# Patient Record
Sex: Female | Born: 1935 | Hispanic: Yes | Smoking: Former smoker
Health system: Southern US, Community
[De-identification: ages and names within clinical notes are randomized; demographics above are authoritative.]

## PROBLEM LIST (undated history)

## (undated) ENCOUNTER — Emergency Department (HOSPITAL_COMMUNITY): Admission: EM | Payer: Self-pay | Source: Home / Self Care

## (undated) DIAGNOSIS — R45851 Suicidal ideations: Secondary | ICD-10-CM

## (undated) DIAGNOSIS — F0390 Unspecified dementia without behavioral disturbance: Secondary | ICD-10-CM

## (undated) DIAGNOSIS — I10 Essential (primary) hypertension: Secondary | ICD-10-CM

## (undated) DIAGNOSIS — F32A Depression, unspecified: Secondary | ICD-10-CM

## (undated) DIAGNOSIS — F419 Anxiety disorder, unspecified: Secondary | ICD-10-CM

## (undated) DIAGNOSIS — K219 Gastro-esophageal reflux disease without esophagitis: Secondary | ICD-10-CM

## (undated) DIAGNOSIS — F329 Major depressive disorder, single episode, unspecified: Secondary | ICD-10-CM

## (undated) HISTORY — DX: Gastro-esophageal reflux disease without esophagitis: K21.9

## (undated) HISTORY — DX: Essential (primary) hypertension: I10

## (undated) HISTORY — PX: EYE SURGERY: SHX253

---

## 1995-05-15 ENCOUNTER — Other Ambulatory Visit: Payer: Self-pay

## 2014-01-29 ENCOUNTER — Ambulatory Visit: Payer: Self-pay | Attending: Family Medicine | Admitting: Family Medicine

## 2014-01-29 ENCOUNTER — Ambulatory Visit (HOSPITAL_COMMUNITY)
Admission: RE | Admit: 2014-01-29 | Discharge: 2014-01-29 | Disposition: A | Discharge status: Home / Self Care | Payer: Self-pay | Source: Ambulatory Visit | Attending: Family Medicine | Admitting: Family Medicine

## 2014-01-29 ENCOUNTER — Encounter: Payer: Self-pay | Admitting: Family Medicine

## 2014-01-29 VITALS — BP 162/92 | HR 51 | Temp 98.0°F | Resp 16 | Wt 144.0 lb

## 2014-01-29 DIAGNOSIS — R0981 Nasal congestion: Secondary | ICD-10-CM | POA: Insufficient documentation

## 2014-01-29 DIAGNOSIS — R0602 Shortness of breath: Secondary | ICD-10-CM | POA: Insufficient documentation

## 2014-01-29 DIAGNOSIS — Z87891 Personal history of nicotine dependence: Secondary | ICD-10-CM | POA: Insufficient documentation

## 2014-01-29 DIAGNOSIS — M47816 Spondylosis without myelopathy or radiculopathy, lumbar region: Secondary | ICD-10-CM

## 2014-01-29 DIAGNOSIS — M545 Low back pain: Secondary | ICD-10-CM | POA: Insufficient documentation

## 2014-01-29 DIAGNOSIS — K429 Umbilical hernia without obstruction or gangrene: Secondary | ICD-10-CM | POA: Insufficient documentation

## 2014-01-29 DIAGNOSIS — R109 Unspecified abdominal pain: Secondary | ICD-10-CM | POA: Insufficient documentation

## 2014-01-29 DIAGNOSIS — H1013 Acute atopic conjunctivitis, bilateral: Secondary | ICD-10-CM

## 2014-01-29 DIAGNOSIS — I10 Essential (primary) hypertension: Secondary | ICD-10-CM | POA: Insufficient documentation

## 2014-01-29 DIAGNOSIS — R52 Pain, unspecified: Secondary | ICD-10-CM | POA: Insufficient documentation

## 2014-01-29 DIAGNOSIS — Z113 Encounter for screening for infections with a predominantly sexual mode of transmission: Secondary | ICD-10-CM

## 2014-01-29 DIAGNOSIS — IMO0001 Reserved for inherently not codable concepts without codable children: Secondary | ICD-10-CM | POA: Insufficient documentation

## 2014-01-29 DIAGNOSIS — K625 Hemorrhage of anus and rectum: Secondary | ICD-10-CM | POA: Insufficient documentation

## 2014-01-29 DIAGNOSIS — G8929 Other chronic pain: Secondary | ICD-10-CM | POA: Insufficient documentation

## 2014-01-29 DIAGNOSIS — K5900 Constipation, unspecified: Secondary | ICD-10-CM | POA: Insufficient documentation

## 2014-01-29 DIAGNOSIS — E559 Vitamin D deficiency, unspecified: Secondary | ICD-10-CM | POA: Insufficient documentation

## 2014-01-29 DIAGNOSIS — K219 Gastro-esophageal reflux disease without esophagitis: Secondary | ICD-10-CM

## 2014-01-29 DIAGNOSIS — R202 Paresthesia of skin: Secondary | ICD-10-CM | POA: Insufficient documentation

## 2014-01-29 DIAGNOSIS — Z0000 Encounter for general adult medical examination without abnormal findings: Secondary | ICD-10-CM

## 2014-01-29 DIAGNOSIS — Z114 Encounter for screening for human immunodeficiency virus [HIV]: Secondary | ICD-10-CM | POA: Insufficient documentation

## 2014-01-29 DIAGNOSIS — M1612 Unilateral primary osteoarthritis, left hip: Secondary | ICD-10-CM

## 2014-01-29 DIAGNOSIS — R4789 Other speech disturbances: Secondary | ICD-10-CM

## 2014-01-29 DIAGNOSIS — M25552 Pain in left hip: Secondary | ICD-10-CM | POA: Insufficient documentation

## 2014-01-29 DIAGNOSIS — J309 Allergic rhinitis, unspecified: Secondary | ICD-10-CM

## 2014-01-29 DIAGNOSIS — L298 Other pruritus: Secondary | ICD-10-CM | POA: Insufficient documentation

## 2014-01-29 DIAGNOSIS — E119 Type 2 diabetes mellitus without complications: Secondary | ICD-10-CM | POA: Insufficient documentation

## 2014-01-29 LAB — GLUCOSE, POCT (MANUAL RESULT ENTRY): POC Glucose: 111 mg/dl — AB (ref 70–99)

## 2014-01-29 LAB — COMPLETE METABOLIC PANEL WITH GFR
ALK PHOS: 98 U/L (ref 39–117)
ALT: 28 U/L (ref 0–35)
AST: 26 U/L (ref 0–37)
Albumin: 4.1 g/dL (ref 3.5–5.2)
BILIRUBIN TOTAL: 0.5 mg/dL (ref 0.2–1.2)
BUN: 19 mg/dL (ref 6–23)
CO2: 26 meq/L (ref 19–32)
Calcium: 9.1 mg/dL (ref 8.4–10.5)
Chloride: 104 mEq/L (ref 96–112)
Creat: 0.69 mg/dL (ref 0.50–1.10)
GFR, EST NON AFRICAN AMERICAN: 80 mL/min
Glucose, Bld: 75 mg/dL (ref 70–99)
Potassium: 4.7 mEq/L (ref 3.5–5.3)
SODIUM: 139 meq/L (ref 135–145)
TOTAL PROTEIN: 7.5 g/dL (ref 6.0–8.3)

## 2014-01-29 LAB — POCT URINALYSIS DIPSTICK
Bilirubin, UA: NEGATIVE
GLUCOSE UA: NEGATIVE
Ketones, UA: NEGATIVE
Leukocytes, UA: NEGATIVE
Nitrite, UA: NEGATIVE
Protein, UA: NEGATIVE
RBC UA: NEGATIVE
Spec Grav, UA: 1.01
Urobilinogen, UA: 0.2
pH, UA: 6.5

## 2014-01-29 LAB — LIPID PANEL
CHOL/HDL RATIO: 3.6 ratio
Cholesterol: 177 mg/dL (ref 0–200)
HDL: 49 mg/dL (ref >39)
LDL Cholesterol: 108 mg/dL — ABNORMAL HIGH (ref 0–99)
Triglycerides: 102 mg/dL (ref <150)
VLDL: 20 mg/dL (ref 0–40)

## 2014-01-29 LAB — VITAMIN B12: Vitamin B-12: 1985 pg/mL — ABNORMAL HIGH (ref 211–911)

## 2014-01-29 LAB — POCT GLYCOSYLATED HEMOGLOBIN (HGB A1C): Hemoglobin A1C: 6.1

## 2014-01-29 LAB — TSH: TSH: 1.829 u[IU]/mL (ref 0.350–4.500)

## 2014-01-29 LAB — LIPASE: Lipase: 19 U/L (ref 0–75)

## 2014-01-29 MED ORDER — FLUTICASONE PROPIONATE 50 MCG/ACT NA SUSP: 2.0000 | Freq: Every day | NASAL | Status: AC

## 2014-01-29 MED ORDER — RANITIDINE HCL 150 MG PO CAPS: 150.0000 mg | ORAL_CAPSULE | Freq: Two times a day (BID) | ORAL | Status: DC

## 2014-01-29 MED ORDER — METHYLPREDNISOLONE ACETATE 80 MG/ML IJ SUSP
80.0000 mg | Freq: Once | INTRAMUSCULAR | Status: AC
  Administered 2014-01-29: 80 mg via INTRAMUSCULAR

## 2014-01-29 NOTE — Patient Instructions (Addendum)
Barbara Beck,  Thank you for coming in today. It was a pleasure meeting you. I look forward to being your primary doctor.   1. HTN: Continue norvasc for now but we will plan to change to a different BP medicine after reviewing labs.  2. Shortness of breath: Evaluate for COPD, CHF, anemia.  Labs Chest x-ray   3. Facial ithching, tearing = allergic rhinoconjunctivitis Shot of steroid today flonase-nasal steroid   4. Umbilical (belly button) hernia: Avoid straining, avoid constipation.   You will be called with results  F/u in 2-3 weeks for lab review and physical with pelvic exam.  Dr. Armen PickupFunches

## 2014-01-29 NOTE — Assessment & Plan Note (Signed)
A: reducible umbilical hernia, chronic, patient is not interested in elective hernia repair P: Avoid straining (heavy lifting, squatting and constipation) Reviewed s/s of incarceration which should urgent evaluation at urgent care or ED with patient and her daughter-in-law

## 2014-01-29 NOTE — Assessment & Plan Note (Signed)
A: Shortness of breath: differential include COPD, CHF, anemia, malignancy P:  CBC with diff, TSH, CMP Chest x-ray followed by 2D ECHO if there is cardiomegaly on CXR

## 2014-01-29 NOTE — Assessment & Plan Note (Signed)
A: HTN: BP above goal with orthostatic symptoms  P: Continue norvasc for now but we will plan to change to a different BP medicine after reviewing labs due to bradycardia and orthostatic symptoms. Will likely switch to an ACE/ARB.

## 2014-01-29 NOTE — Progress Notes (Signed)
Pt is here to establish care. Pt states that she has chronic pain on her left side. Pt states that she may have a hernia. Pt wants to be tested for diabetes. Pt has an interpreter.

## 2014-01-29 NOTE — Assessment & Plan Note (Addendum)
A: exam and history support this diagnosis P: Shot of steroid today, Depo medrol 80 mg IM x one (chose IM instead or oral because of GERD hx) followed by flonase-nasal steroid

## 2014-01-29 NOTE — Assessment & Plan Note (Signed)
Screening HIV  

## 2014-01-29 NOTE — Progress Notes (Addendum)
   Subjective:    Patient ID: Barbara Beck, female    DOB: 1927/07/30, 81 y.o.   MRN: [id number] CC: establish care, chronic R side pain (? Hernia), HTN, screening for diabetes  HPI  81 yo Hispanic female, spanish speaking, presents with her daughter-in-law to establish care and discuss the following: History: obtained via use of Spanish interpreter  1. HTN: Does not recall date diagnosis. Is compliant with her Norvasc 5 mg daily. She admits to shortness of breath when lying flat and dizziness on standing. She denies acute loss of vision, headache, chest pain, syncope and peripheral edema.  2. R side pain: This is a chronic problem. Sharp right lower abdomen. No associated fever, chills, nausea, vomiting, diarrhea or dysuria. Patient admits to intermittent constipation, occasionally accompanied by rectal bleeding.  3. Facial itching: Chronic problem. Patient is associated with dry skin especially around her eyes. Excessive tearing. Nasal congestion.  4. SOB: The is patient has been short of breath especially when lying flat. She denies chronic cough. She denies chest pain. She denies peripheral edema. She admits to cold intolerance.  5. Joint pain: Patient with chronic low back and left hip pain. She takes medication: Continue diclofenac along with vitamin B1 and B6 and B12. Medication helps her pain. She takes medication as needed.  Soc Hx: former smoker 2 PPD for 13 years, quit in 1986 Med Hx: HTN unsure of diagnosis Surg Hx: cataract surgery L eye unsure of date  Review of Systems As per HPI Low back pain. Left hip pain.    Objective:   Physical Exam BP 162/92 mmHg  Pulse 51  Temp(Src) 98 F (36.7 C) (Oral)  Resp 16  Wt 144 lb (65.318 kg)  SpO2 51% General appearance: alert, cooperative and no distress, elderly female  Skin: warm, dry, intact, xerotic with hyperpigmentation under the eyes.  Nose: swollen nasal turbinate, pale, no drainage or erythema Throat: normal tongue,  could not visualize oropharynx even with tongue depressor Neck: swollen submental lymph nodes  Lungs: clear to auscultation bilaterally Heart: regular rate and rhythm, S1, S2 normal, no murmur, click, rub or gallop Abdomen: NABS, obese, soft, reducible superior umbilical hernia, mild TTP w/o rebound or guarding in RLQ and epigastric area.  Extremities: traceedema    Lab Results  Component Value Date   HGBA1C 6.1 01/29/2014       Assessment & Plan:

## 2014-01-30 LAB — HIV ANTIBODY (ROUTINE TESTING W REFLEX): HIV 1&2 Ab, 4th Generation: NONREACTIVE

## 2014-01-31 NOTE — Addendum Note (Signed)
Addended by: Dessa PhiFUNCHES, Symir Mah on: 01/31/2014 06:21 PM   Modules accepted: Orders, Medications

## 2014-02-01 ENCOUNTER — Telehealth: Payer: Self-pay | Admitting: *Deleted

## 2014-02-01 NOTE — Telephone Encounter (Signed)
Left voice message to return call (information was left in Spanish)

## 2014-02-01 NOTE — Telephone Encounter (Signed)
-----   Message from Lora PaulaJosalyn C Funches, MD sent at 01/31/2014  6:21 PM EST ----- Normal labs. With slightly elevated LDL and high B12 consistent with over supplementation.  Patient may discontinue simvastatin

## 2014-02-12 ENCOUNTER — Telehealth: Payer: Self-pay | Admitting: Family Medicine

## 2014-02-12 NOTE — Telephone Encounter (Addendum)
Patients family member called to update the information needed for her mothers [employer] transportation application to be completed. [employer] stated that on page 2, question 5 the "yes" box was checked of but not given the "explanation". Facility would like the form to be completed and faxed to [employer] Fax:[phone]. Please f/u with family member for any questions.

## 2014-02-19 ENCOUNTER — Ambulatory Visit: Payer: Self-pay | Attending: Family Medicine | Admitting: Family Medicine

## 2014-02-19 ENCOUNTER — Other Ambulatory Visit: Payer: Self-pay

## 2014-02-19 ENCOUNTER — Encounter: Payer: Self-pay | Admitting: Family Medicine

## 2014-02-19 ENCOUNTER — Ambulatory Visit (HOSPITAL_COMMUNITY)
Admission: RE | Admit: 2014-02-19 | Discharge: 2014-02-19 | Disposition: A | Discharge status: Home / Self Care | Payer: Self-pay | Source: Ambulatory Visit | Attending: Family Medicine | Admitting: Family Medicine

## 2014-02-19 VITALS — BP 178/92 | HR 44 | Temp 97.9°F | Resp 16 | Ht 58.5 in | Wt 136.0 lb

## 2014-02-19 DIAGNOSIS — Z23 Encounter for immunization: Secondary | ICD-10-CM

## 2014-02-19 DIAGNOSIS — H269 Unspecified cataract: Secondary | ICD-10-CM

## 2014-02-19 DIAGNOSIS — J029 Acute pharyngitis, unspecified: Secondary | ICD-10-CM | POA: Insufficient documentation

## 2014-02-19 DIAGNOSIS — R001 Bradycardia, unspecified: Secondary | ICD-10-CM | POA: Insufficient documentation

## 2014-02-19 DIAGNOSIS — N750 Cyst of Bartholin's gland: Secondary | ICD-10-CM

## 2014-02-19 DIAGNOSIS — I10 Essential (primary) hypertension: Secondary | ICD-10-CM

## 2014-02-19 MED ORDER — LISINOPRIL-HYDROCHLOROTHIAZIDE 20-12.5 MG PO TABS: 1.0000 | ORAL_TABLET | Freq: Every day | ORAL | Status: DC

## 2014-02-19 NOTE — Assessment & Plan Note (Signed)
A: chronic cyst w/o pain P: monitoring. Gyn referral if it becomes symptomatic

## 2014-02-19 NOTE — Assessment & Plan Note (Signed)
R eye cataract: referral to ophthalmology

## 2014-02-19 NOTE — Patient Instructions (Addendum)
Barbara Beck,  Thank you for coming in today.   1. High blood pressure goal is < 150/90 Start prinzide 20-12.5 mg once daily Return in 3 weeks for repeat BP check with the nurse with plan to increase to two tabs if BP above goal.   2. Slow heart rate:  Referral to cardiology   3. R eye cataract: referral to ophthalmology   4. Bartholin gland cyst: cyst without abscess. since it is not bothering you it does not need to be treated. If it starts to bother you I will refer you to gynecology.   5. Unsure if uterus is present, w/o bleeding or pelvic pain. No need for imaging at this time. Cervix is present.   F/u in 3 weeks with nurse for BP check  F/u with me in 3 months for high blood pressure sooner if needed  Dr. Georgena SpurlingFunches   Absceso o quiste de Bartolino (Bartholin's Cyst or Abscess) Las glndulas de GreshamBartolino son glndulas pequeas ubicadas dentro de los pliegues de la piel (labios) a los lados de la apertura de la vagina (canal del parto). Cuando el conducto de la glndula se Lynchobstruye, puede desarrollarse un quiste. Cuando esto ocurre, el lquido que se acumula dentro del quiste puede llegar a infectarse. Esto se conoce como absceso. La glndula de Bartolino produce una mucosidad lquida en la parte externa de la vagina durante las relaciones sexuales. SNTOMAS  Los Lyondell Chemicalpacientes que presentan un quiste pequeo no tienen problemas.  Podr sentir desde una leve molestia a un dolor intenso segn el tamao del quiste y si existe infeccin o no,  Financial risk analystentir dolor, inflamacin e hinchazn en la zona inferior de la vagina.  Dolor en las relaciones sexuales.  Presin en las zona del perineo.  Hinchazn de los labios de la vagina.  El quiste puede estar en uno o ambos lados de la vagina. DIAGNSTICO  El profesional podr observar una gran zona hinchada en la parte inferior de la vagina.  Es una zona dolorosa al tacto.  Si se trata de un absceso habr inflamacin y  Engineer, miningdolor. TRATAMIENTO  En algunos casos el quiste desaparecer sin tratamiento.  Aplique compresas tibias hmedas en la zona o tome baos de asiento varias veces al da.  Le practicarn una incisin para drenar el quiste o el absceso, previa aplicacin de anestesia local.  Si se trata de un absceso le indicarn un cultivo del pus.  Y en ese caso le prescribirn un tratamiento con antibiticos.  Se realizar una abertura en la glndula, suturando los bordes para hacer la abertura ms grande (Methuen Townmarsupializacin).  Si aparece nuevamente el quiste o el absceso, le extirparn toda la glndula. PREVENCIN  Mantenga una buena higiene.  Higienice la zona vaginal con jabn neutro y un pao suave.  No frote la zona al darse un bao.  Proteja la zona de la entrepierna con un apsito si realiza largos paseos en bicicleta o a caballo.  Asegrese de estar bien lubricada cuando mantenga relaciones sexuales. INSTRUCCIONES PARA EL CUIDADO DOMICILIARIO  Si su quiste o absceso ha sido abierto, pudieran haberle colocado un pequeo trozo de gasa o un drenaje para permitir que la herida supure. La gasa o el drenaje a menos que se lo indique el profesional que le asiste.  Use toallas femeninas y no tampones cuando lo necesite en caso de drenaje o sangrado.  Si le han recetado medicamentos que combaten los grmenes (antibiticos ), tmelos exactamente de la manera que le haya sido indicada. Asegrese  de terminar con todo el ciclo de antibiticos.  Utilice los medicamentos de venta libre o de prescripcin para Chief Technology Officer, Environmental health practitioner o la Frontin, segn se lo indique el profesional que lo asiste. SOLICITE ATENCIN MDICA DE INMEDIATO SI:  Aumenta el dolor, el enrojecimiento, la hinchazn o la supuracin.  La herida ha sangrado al punto que ha debido usar ms apsitos de los que la cantidad de apsitos sugerida por el mdico en 24 horas.  Siente escalofros.  Tiene fiebre.  Tiene algn problema (sntoma)  nuevo o se agravan lo ya existentes. EST SEGURO QUE:  Comprende las instrucciones para el alta mdica.  Controlar su enfermedad.  Solicitar atencin mdica de inmediato segn las indicaciones. Document Released: 01/04/2005 Document Revised: 03/29/2011 [facility] Patient Information 2015 [place], [place]. This information is not intended to replace advice given to you by your health care provider. Make sure you discuss any questions you have with your health care provider.

## 2014-02-19 NOTE — Progress Notes (Signed)
   Subjective:    Patient ID: Barbara Beck, female    DOB: 04-01-1927, 81 y.o.   MRN: [id number] CC: wellness physical  HPI   HM: due for flu and amenable to it  Review of Systems General:  Negative for nexplained weight loss, fever Skin: Negative for new or changing mole, sore that won't heal HEENT: Negative for trouble hearing, trouble seeing, ringing in ears, mouth sores, hoarseness, change in voice, dysphagia. CV:  Negative for chest pain, dyspnea, edema, palpitations Resp: Negative for cough, dyspnea, hemoptysis GI: Negative for nausea, vomiting, diarrhea, constipation, abdominal pain, melena, hematochezia. GU: Negative for dysuria, incontinence, urinary hesitance, hematuria, vaginal or penile discharge, polyuria, sexual difficulty, lumps in testicle or breasts MSK: Negative for muscle cramps or aches, joint pain or swelling Neuro: Negative for headaches, weakness, numbness, dizziness, passing out/fainting Psych: Negative for depression, anxiety, memory problems      Objective:   Physical Exam BP 178/92 mmHg  Pulse 44  Temp(Src) 97.9 F (36.6 C)  Resp 16  Ht 4' 10.5" (1.486 m)  Wt 136 lb (61.689 kg)  BMI 27.94 kg/m2  SpO2 99%  General Appearance:    Alert, cooperative, no distress, appears stated age  Head:    Normocephalic, without obvious abnormality, atraumatic  Eyes:    PERRL, cornea clear and irregular on L. Cornea cloudy on R.   Ears:    Normal TM's and external ear canals, both ears  Nose:   Swollen turbinates pink w/o discharge   Throat:   Upper dentures, lower with partial, no abscess, L anterior tongue slightly purple (patient report biting tongue)   Neck:   Supple, symmetrical, trachea midline, thyroid not enlarge. Anterior cervical adenopathy b/l mild, non tender.      Back:     Symmetric, no curvature, ROM normal, no CVA tenderness  Lungs:     Clear to auscultation bilaterally, respirations unlabored  Chest Wall:    No tenderness or deformity   Heart:     Brady heart rate, regular  rhythm, S1 and S2 normal, no murmur, rub   or gallop  Breast Exam:    No tenderness, masses, or nipple abnormality  Abdomen:     Soft, non-tender, bowel sounds active all four quadrants,    no masses, no organomegaly  Genitalia:    Cystic swelling at R introitus, non tender. Speculum exam reveals normal cervix scant white vaginal discharge. bimanual exam w/o fundal or adnexal mass or tenderness.   Rectal:   Deferred   Extremities:   Extremities normal, atraumatic, no cyanosis or edema  Pulses:   2+ and symmetric all extremities  Skin:   Skin color, texture, turgor normal, no rashes or lesions  Lymph nodes:   Cervical, supraclavicular, and axillary nodes normal  Neurologic:   CNII-XII intact, normal strength, sensation and reflexes    throughout    EKG: sinus bradycardia.      Assessment & Plan:

## 2014-02-19 NOTE — Assessment & Plan Note (Signed)
1. High blood pressure goal is < 150/90 Start prinzide 20-12.5 mg once daily Return in 3 weeks for repeat BP check with the nurse with plan to increase to  prinzide 40-25 if BP above goal.

## 2014-02-19 NOTE — Progress Notes (Signed)
Interpreter graciela used as well as pacific interpreter [phone] Patient here for annual exam Last visit she was told that her amlodipine may be switched once MD reviewed labs Patient had episode of diarrhea after she ate today and then she felt weak.  Patient [place] Patient agrees to flu shot today

## 2014-02-19 NOTE — Assessment & Plan Note (Signed)
A. Slow heart rate: sinus bradycardia. Patient not taking norvasc for 15 days P: Treat HTN Referral to cardiology

## 2014-02-20 ENCOUNTER — Ambulatory Visit (HOSPITAL_COMMUNITY)
Admission: RE | Admit: 2014-02-20 | Discharge: 2014-02-20 | Disposition: A | Discharge status: Home / Self Care | Payer: Self-pay | Source: Ambulatory Visit | Attending: Cardiology | Admitting: Cardiology

## 2014-02-20 ENCOUNTER — Encounter: Payer: Self-pay | Admitting: Family Medicine

## 2014-02-25 ENCOUNTER — Telehealth: Payer: Self-pay | Admitting: Family Medicine

## 2014-02-25 ENCOUNTER — Ambulatory Visit: Payer: Self-pay | Attending: Family Medicine

## 2014-02-25 NOTE — Telephone Encounter (Signed)
Patient is requesting a refill for:  Calcium Carb-Cholecalciferol (CALCIUM-VITAMIN D) 600-400 MG-UNIT TABS diclofenac (VOLTAREN) 75 MG EC tablet Thiamine 50 MG CAPS pyridOXINE (B-6) 50 MG tablet cyanocobalamin 1000 MCG tablet  Please follow up with pt.

## 2014-02-26 ENCOUNTER — Telehealth: Payer: Self-pay | Admitting: *Deleted

## 2014-02-26 ENCOUNTER — Other Ambulatory Visit: Payer: Self-pay | Admitting: *Deleted

## 2014-02-28 MED ORDER — THIAMINE 50 MG PO CAPS: 50.0000 mg | ORAL_CAPSULE | Freq: Every day | ORAL | Status: DC

## 2014-02-28 MED ORDER — PYRIDOXINE HCL 50 MG PO TABS: 50.0000 mg | ORAL_TABLET | Freq: Every day | ORAL | Status: DC

## 2014-02-28 MED ORDER — CYANOCOBALAMIN 500 MCG PO TABS: 500.0000 ug | ORAL_TABLET | Freq: Every day | ORAL | Status: AC

## 2014-02-28 NOTE — Addendum Note (Signed)
Addended by: Dessa PhiFUNCHES, Ruqayya Ventress on: 02/28/2014 06:48 PM   Modules accepted: Orders

## 2014-02-28 NOTE — Telephone Encounter (Signed)
Vit b6 and B12, thiamine refilled. Please d/c diclofenac and use tylenol 650 mg TID instead for pain due to HTN.

## 2014-03-01 NOTE — Telephone Encounter (Signed)
LVM to return call.

## 2014-03-11 ENCOUNTER — Ambulatory Visit: Payer: Self-pay | Attending: Family Medicine | Admitting: *Deleted

## 2014-03-11 VITALS — BP 134/60 | HR 53 | Temp 98.4°F | Resp 18

## 2014-03-11 DIAGNOSIS — I10 Essential (primary) hypertension: Secondary | ICD-10-CM | POA: Insufficient documentation

## 2014-03-11 MED ORDER — ACETAMINOPHEN 325 MG PO TABS: 650.0000 mg | ORAL_TABLET | Freq: Three times a day (TID) | ORAL | Status: DC | PRN

## 2014-03-11 NOTE — Progress Notes (Signed)
Spoke with patient via [employer] Interpreter, Star [place], [place] [phone] Patient presents with daughter-in-law for BP check Med list reviewed; states taking all meds as directed Does not add salt or cook with salt. Eats fresh foods [facility] Doing yoga and chair exercises 5 days a week C/o diarrhea (watery stools) 4 times/day directly after eating for several months. [place] was there June 2015 through January 2016  BP 134/60 P 53 R  18 T  98.4 oral SPO2  97%  Advised to make f/u with PCP to discuss diarrhea  Patient advised to call for med refills at least 7 days before running out so as not to go without.

## 2014-03-19 ENCOUNTER — Ambulatory Visit: Payer: Self-pay | Attending: Family Medicine | Admitting: Family Medicine

## 2014-03-19 ENCOUNTER — Encounter: Payer: Self-pay | Admitting: Family Medicine

## 2014-03-19 VITALS — BP 162/77 | HR 54 | Temp 97.8°F | Resp 16 | Ht 59.0 in | Wt 132.0 lb

## 2014-03-19 DIAGNOSIS — K529 Noninfective gastroenteritis and colitis, unspecified: Secondary | ICD-10-CM

## 2014-03-19 DIAGNOSIS — I10 Essential (primary) hypertension: Secondary | ICD-10-CM

## 2014-03-19 MED ORDER — LISINOPRIL-HYDROCHLOROTHIAZIDE 20-25 MG PO TABS: 1.0000 | ORAL_TABLET | Freq: Every day | ORAL | Status: DC

## 2014-03-19 MED ORDER — LISINOPRIL-HYDROCHLOROTHIAZIDE 20-12.5 MG PO TABS: 2.0000 | ORAL_TABLET | Freq: Every day | ORAL | Status: DC

## 2014-03-19 NOTE — Assessment & Plan Note (Signed)
Assessment:  Chronic diarrhea: improved.  Normal abdominal exam today You have lost weight since the beginning of the year Wt Readings from Last 3 Encounters:  03/19/14 132 lb (59.875 kg)  02/19/14 136 lb (61.689 kg)  01/29/14 144 lb (65.318 kg)   Plan:  To clarify, calcium carbonate may cause bloating, constipation and gas (not diarrhea).  Plan to monitor and collect stools-if watery will perform stool studies. Continue to eat regular meals. If you notice blood in stool please notify me if that occurs a diagnostic colonoscopy may be indicated.

## 2014-03-19 NOTE — Progress Notes (Signed)
Patient here for follow up to diarrhea. Patient states that her diarrhea is better. Patient complain of pain like her nerves and tendons hurt at times. Patient states that her b/p is dropping in the mornings after having elevated b/p.

## 2014-03-19 NOTE — Patient Instructions (Addendum)
Barbara Beck,  Thank you for coming in today.  1. HTN: Elevated BP with low pulse Increase prinzide to two tab once daily from 20-12.5 to 20-25 mg (new prescription, increased dose of diuretic hydrochlorothiazide).  Low pulse is called bradycardia if bradycardia becomes symptomatic (fainting) seeing a cardiologist to discuss a pacemaker is recmmended   2. Diarrhea: Normal abdominal exam today You have lost weight since the beginning of the year Wt Readings from Last 3 Encounters:  03/19/14 132 lb (59.875 kg)  02/19/14 136 lb (61.689 kg)  01/29/14 144 lb (65.318 kg)   To clarify, calcium carbonate may cause bloating, constipation and gas (not diarrhea).  Plan to monitor and collect stools-if watery will perform stool studies. Continue to eat regular meals. If you notice blood in stool please notify me if that occurs a diagnostic colonoscopy may be indicated.   F/u for BP check with nurse in 4 weeks   F/u with me in 3 months   Dr. Armen PickupFunches

## 2014-03-19 NOTE — Progress Notes (Signed)
   Subjective:    Patient ID: Barbara Beck, female    DOB: 03-05-1927, 81 y.o.   MRN: [id number] CC: f/u HTN, diarrhea  HPI  1. CHRONIC HYPERTENSION  Disease Monitoring  Blood pressure range: 134-177/87-96, Hr 47-53  Chest pain: no   Dyspnea: no   Claudication: no   Medication compliance: no  Medication Side Effects  Lightheadedness: no   Urinary frequency: no   Edema: no   2. Diarrhea: x 6 months while in [place]. Non-bloody. Per patient triggered by certain foods. No abdominal pain, cramping, fever. Patient reports normal stools x 1 week. No history of screening colonoscopy.   Review of Systems As per HPI     Objective:   Physical Exam BP 162/77 mmHg  Pulse 54  Temp(Src) 97.8 F (36.6 C)  Resp 16  Ht 4\' 11"  (1.499 m)  Wt 132 lb (59.875 kg)  BMI 26.65 kg/m2  SpO2 96%  Wt Readings from Last 3 Encounters:  03/19/14 132 lb (59.875 kg)  02/19/14 136 lb (61.689 kg)  01/29/14 144 lb (65.318 kg)  General appearance: alert, cooperative and no distress Lungs: clear to auscultation bilaterally Heart: regular rate and rhythm, S1, S2 normal, no murmur, click, rub or gallop Abdomen: soft, non-tender; bowel sounds normal; no masses,  no organomegaly     Assessment & Plan:

## 2014-03-19 NOTE — Assessment & Plan Note (Signed)
1. HTN: Elevated BP with low pulse Increase prinzide to two tab once daily from 20-12.5 to 20-25 mg (new prescription, increased dose of diuretic hydrochlorothiazide).  Low pulse is called bradycardia if bradycardia becomes symptomatic (fainting) seeing a cardiologist to discuss a pacemaker is recmmended

## 2014-04-12 ENCOUNTER — Ambulatory Visit (INDEPENDENT_AMBULATORY_CARE_PROVIDER_SITE_OTHER): Payer: No Typology Code available for payment source | Admitting: Cardiovascular Disease

## 2014-04-12 ENCOUNTER — Encounter: Payer: Self-pay | Admitting: Cardiovascular Disease

## 2014-04-12 VITALS — BP 144/80 | HR 57 | Ht 59.0 in | Wt 128.8 lb

## 2014-04-12 DIAGNOSIS — R0600 Dyspnea, unspecified: Secondary | ICD-10-CM

## 2014-04-12 DIAGNOSIS — R001 Bradycardia, unspecified: Secondary | ICD-10-CM

## 2014-04-12 DIAGNOSIS — I10 Essential (primary) hypertension: Secondary | ICD-10-CM

## 2014-04-12 MED ORDER — AMLODIPINE BESYLATE 2.5 MG PO TABS: 2.5000 mg | ORAL_TABLET | Freq: Every day | ORAL | Status: DC

## 2014-04-12 NOTE — Progress Notes (Signed)
Cardiology Office Note   Date:  04/12/2014   ID:  Nitzia Perren, DOB [birth year], MRN [id number]  PCP:  Lora Paula, MD  Cardiologist:   Vesta Mixer, MD   No chief complaint on file.     History of Present Illness: Barbara Beck is a 81 y.o. female who presents for evaluation of HTN She was seen with Delorise Royals ( intrepreter ) .  and daughter in law  She is orginally from [place].   Is seen at the [facility].  Was referred for further evaluation of this HTN. The BP has been well controlled until she moved to [place] She has been on the Lisinopril which has not worked as well has her previous BP pill that she was on while in [place].  She does not recall which medications she's been on in the past. She has lots of burning in legs  - sound like peripheral neuropathy.   No CP .  She does have DOE -  Goes to the senior center daily and does chair exercises.   Past Medical History  Diagnosis Date  . Hypertension ?   Marland Kitchen GERD (gastroesophageal reflux disease) ?     Past Surgical History  Procedure Laterality Date  . Eye surgery Left     Cataract Surgery      Current Outpatient Prescriptions  Medication Sig Dispense Refill  . acetaminophen (TYLENOL) 325 MG tablet Take 2 tablets (650 mg total) by mouth 3 (three) times daily as needed for moderate pain. 50 tablet 1  . Calcium Carb-Cholecalciferol (CALCIUM-VITAMIN D) 600-400 MG-UNIT TABS Take by mouth.    . cyanocobalamin 500 MCG tablet Take 1 tablet (500 mcg total) by mouth daily. 90 tablet 3  . lisinopril-hydrochlorothiazide (PRINZIDE,ZESTORETIC) 20-25 MG per tablet Take 1 tablet by mouth daily. 90 tablet 3  . pyridOXINE (B-6) 50 MG tablet Take 1 tablet (50 mg total) by mouth daily. 90 tablet 1  . ranitidine (ZANTAC) 150 MG capsule Take 1 capsule (150 mg total) by mouth 2 (two) times daily. 60 capsule 2  . Thiamine 50 MG CAPS Take 1 capsule (50 mg total) by mouth daily. 90 capsule 3  .  fluticasone (FLONASE) 50 MCG/ACT nasal spray Place 2 sprays into both nostrils daily. (Patient not taking: Reported on 04/12/2014) 16 g 6   No current facility-administered medications for this visit.    Allergies:   Aspirin    Social History:  The patient  reports that she quit smoking about 30 years ago. She has never used smokeless tobacco. She reports that she does not drink alcohol or use illicit drugs.   Family History:  The patient's family history includes Cancer in her daughter.    ROS:  Please see the history of present illness.    Review of Systems: Constitutional:  denies fever, chills, diaphoresis, appetite change and fatigue.  HEENT: denies photophobia, eye pain, redness, hearing loss, ear pain, congestion, sore throat, rhinorrhea, sneezing, neck pain, neck stiffness and tinnitus.  Respiratory: denies SOB, DOE, cough, chest tightness, and wheezing.  Cardiovascular: denies chest pain, palpitations and leg swelling.  Gastrointestinal: denies nausea, vomiting, abdominal pain, diarrhea, constipation, blood in stool.  Genitourinary: denies dysuria, urgency, frequency, hematuria, flank pain and difficulty urinating.  Musculoskeletal: denies  myalgias, back pain, joint swelling, arthralgias and gait problem.   Skin: denies pallor, rash and wound.  Neurological: denies dizziness, seizures, syncope, weakness, light-headedness, numbness and headaches.   Hematological: denies adenopathy, easy bruising, personal  or family bleeding history.  Psychiatric/ Behavioral: denies suicidal ideation, mood changes, confusion, nervousness, sleep disturbance and agitation.       All other systems are reviewed and negative.    PHYSICAL EXAM: VS:  BP 144/80 mmHg  Pulse 57  Ht 4\' 11"  (1.499 m)  Wt 128 lb 12.8 oz (58.423 kg)  BMI 26.00 kg/m2  SpO2 92% , BMI Body mass index is 26 kg/(m^2). GEN: Well nourished, well developed, in no acute distress HEENT: normal Neck: no JVD, carotid bruits,  or masses Cardiac: RRR; normal S1S2 no murmurs, rubs, or gallops,no edema  Respiratory:  clear to auscultation bilaterally, normal work of breathing GI: soft, nontender, nondistended, + BS MS: no deformity or atrophy Skin: warm and dry, no rash Neuro:  Strength and sensation are intact Psych: normal   EKG:  EKG is not ordered today. The ekg ordered today demonstrates    Recent Labs: 01/29/2014: ALT 28; BUN 19; Creatinine 0.69; Potassium 4.7; Sodium 139; TSH 1.829    Lipid Panel    Component Value Date/Time   CHOL 177 01/29/2014 1222   TRIG 102 01/29/2014 1222   HDL 49 01/29/2014 1222   CHOLHDL 3.6 01/29/2014 1222   VLDL 20 01/29/2014 1222   LDLCALC 108* 01/29/2014 1222      Wt Readings from Last 3 Encounters:  04/12/14 128 lb 12.8 oz (58.423 kg)  03/19/14 132 lb (59.875 kg)  02/19/14 136 lb (61.689 kg)      Other studies Reviewed: Additional studies/ records that were reviewed today include: . Review of the above records demonstrates:    ASSESSMENT AND PLAN:  1. Essential hypertension - we will add low-dose amlodipine-2.5 mg a day. Her blood pressure is just minimally elevated. If she remained stable and does not have any significant abnormalities seen on her echocardiogram then we will turn her back over to her general medical doctor.  2. Sinus Bradycardia- her heart rate is now normal. Continue current medications.  2. Shortness of breath with exertion: The patient has severe shortness breath with any sort of exertion. She's had hypertension for a long time. We will get an echocardiogram for further evaluation of what sounds like diastolic dysfunction.    Current medicines are reviewed at length with the patient today.  The patient does not have concerns regarding medicines.  The following changes have been made:  no change  Labs/ tests ordered today include:  No orders of the defined types were placed in this encounter.     Disposition:   FU with me in 3  months.     Signed, Edith Lord, Deloris PingPhilip J, MD  04/12/2014 4:11 PM    [facility] [address], [place], [place]  [id number] Phone: [phone]) [phone]; Fax: [phone]) [phone]

## 2014-04-12 NOTE — Patient Instructions (Signed)
Your physician has recommended you make the following change in your medication:  START Amlodipine 2.5 mg once daily  Your physician has requested that you have an echocardiogram. Echocardiography is a painless test that uses sound waves to create images of your heart. It provides your doctor with information about the size and shape of your heart and how well your heart's chambers and valves are working. This procedure takes approximately one hour. There are no restrictions for this procedure.  Your physician recommends that you schedule a follow-up appointment in: 3 months with Dr. Elease HashimotoNahser

## 2014-04-17 ENCOUNTER — Ambulatory Visit (HOSPITAL_COMMUNITY): Payer: No Typology Code available for payment source | Attending: Cardiovascular Disease | Admitting: Radiology

## 2014-04-17 DIAGNOSIS — R0600 Dyspnea, unspecified: Secondary | ICD-10-CM | POA: Insufficient documentation

## 2014-04-17 NOTE — Progress Notes (Signed)
Echocardiogram performed.  

## 2014-04-24 ENCOUNTER — Telehealth: Payer: Self-pay | Admitting: Nurse Practitioner

## 2014-04-24 NOTE — Telephone Encounter (Signed)
Echo result per Dr. Elease HashimotoNahser: Mild LVH.     Normal LV function.        Mild diastolic dysfunction.    Left message for patient to call office for results

## 2014-05-01 NOTE — Telephone Encounter (Signed)
Left detailed message on voice mail that results show mild diastolic dysfunction, no medication or treatment changes and to call office with questions or concerns.

## 2014-07-08 ENCOUNTER — Encounter: Payer: Self-pay | Admitting: *Deleted

## 2014-07-08 ENCOUNTER — Telehealth: Payer: Self-pay | Admitting: *Deleted

## 2014-07-08 NOTE — Telephone Encounter (Signed)
called for fm hx, unable to reach pt.. 

## 2014-07-10 ENCOUNTER — Ambulatory Visit (INDEPENDENT_AMBULATORY_CARE_PROVIDER_SITE_OTHER): Payer: PRIVATE HEALTH INSURANCE | Admitting: Cardiovascular Disease

## 2014-07-10 ENCOUNTER — Encounter: Payer: Self-pay | Admitting: Cardiovascular Disease

## 2014-07-10 VITALS — BP 124/72 | HR 61 | Ht 59.0 in | Wt 126.1 lb

## 2014-07-10 DIAGNOSIS — I10 Essential (primary) hypertension: Secondary | ICD-10-CM

## 2014-07-10 LAB — BASIC METABOLIC PANEL
BUN: 14 mg/dL (ref 6–23)
CALCIUM: 9.2 mg/dL (ref 8.4–10.5)
CO2: 30 mEq/L (ref 19–32)
CREATININE: 0.8 mg/dL (ref 0.40–1.20)
Chloride: 102 mEq/L (ref 96–112)
GFR: 72.5 mL/min (ref >60.00)
Glucose, Bld: 75 mg/dL (ref 70–99)
Potassium: 4.1 mEq/L (ref 3.5–5.1)
Sodium: 139 mEq/L (ref 135–145)

## 2014-07-10 NOTE — Progress Notes (Signed)
Cardiology Office Note   Date:  07/10/2014   ID:  Barbara Beck, DOB 09-18-1927, MRN [id number]  PCP:  Lora Paula, MD  Cardiologist:   Vesta Mixer, MD   Chief Complaint  Patient presents with  . Hypertension      History of Present Illness: Barbara Beck is a 81 y.o. female who presents for evaluation of HTN She was seen with Barbara Beck ( intrepreter ) .  and daughter in law  She is orginally from [place].   Is seen at the [facility].  Was referred for further evaluation of this HTN. The BP has been well controlled until she moved to [place] She has been on the Lisinopril which has not worked as well has her previous BP pill that she was on while in [place].  She does not recall which medications she's been on in the past. She has lots of burning in legs  - sound like peripheral neuropathy.   No CP .  She does have DOE -  Goes to the senior center daily and does chair exercises.   July 10, 2014:   Barbara Beck is doing well.    Was seen with Intrepreter, Doran Stabler.   Amlodipine was added to her medication list last visit Echo:  Normal LV systolic function Mild diastolic function   Feels well     Past Medical History  Diagnosis Date  . Hypertension ?   Marland Kitchen GERD (gastroesophageal reflux disease) ?     Past Surgical History  Procedure Laterality Date  . Eye surgery Left     Cataract Surgery      Current Outpatient Prescriptions  Medication Sig Dispense Refill  . acetaminophen (TYLENOL) 325 MG tablet Take 2 tablets (650 mg total) by mouth 3 (three) times daily as needed for moderate pain. 50 tablet 1  . amLODipine (NORVASC) 2.5 MG tablet Take 1 tablet (2.5 mg total) by mouth daily. 31 tablet 11  . Calcium Carb-Cholecalciferol (CALCIUM-VITAMIN D) 600-400 MG-UNIT TABS Take 1 tablet by mouth daily.     . cyanocobalamin 500 MCG tablet Take 1 tablet (500 mcg total) by mouth daily. 90 tablet 3  . fluticasone (FLONASE) 50 MCG/ACT  nasal spray Place 2 sprays into both nostrils daily. 16 g 6  . lisinopril-hydrochlorothiazide (PRINZIDE,ZESTORETIC) 20-25 MG per tablet Take 1 tablet by mouth daily. 90 tablet 3  . pyridOXINE (B-6) 50 MG tablet Take 1 tablet (50 mg total) by mouth daily. 90 tablet 1  . ranitidine (ZANTAC) 150 MG capsule Take 1 capsule (150 mg total) by mouth 2 (two) times daily. 60 capsule 2  . Thiamine 50 MG CAPS Take 1 capsule (50 mg total) by mouth daily. 90 capsule 3   No current facility-administered medications for this visit.    Allergies:   Aspirin    Social History:  The patient  reports that she quit smoking about 30 years ago. She has never used smokeless tobacco. She reports that she does not drink alcohol or use illicit drugs.   Family History:  The patient's family history includes Cancer in her daughter; Hypertension in her father.    ROS:  Please see the history of present illness.    Review of Systems: Constitutional:  denies fever, chills, diaphoresis, appetite change and fatigue.  HEENT: denies photophobia, eye pain, redness, hearing loss, ear pain, congestion, sore throat, rhinorrhea, sneezing, neck pain, neck stiffness and tinnitus.  Respiratory: denies SOB, DOE, cough, chest tightness, and wheezing.  Cardiovascular: denies chest pain, palpitations and leg swelling.  Gastrointestinal: denies nausea, vomiting, abdominal pain, diarrhea, constipation, blood in stool.  Genitourinary: denies dysuria, urgency, frequency, hematuria, flank pain and difficulty urinating.  Musculoskeletal: denies  myalgias, back pain, joint swelling, arthralgias and gait problem.   Skin: denies pallor, rash and wound.  Neurological: denies dizziness, seizures, syncope, weakness, light-headedness, numbness and headaches.   Hematological: denies adenopathy, easy bruising, personal or family bleeding history.  Psychiatric/ Behavioral: denies suicidal ideation, mood changes, confusion, nervousness, sleep  disturbance and agitation.       All other systems are reviewed and negative.    PHYSICAL EXAM: VS:  BP 124/72 mmHg  Pulse 61  Ht 4\' 11"  (1.499 m)  Wt 57.208 kg (126 lb 1.9 oz)  BMI 25.46 kg/m2 , BMI Body mass index is 25.46 kg/(m^2). GEN: Well nourished, well developed, in no acute distress HEENT: normal Neck: no JVD, carotid bruits, or masses Cardiac: RRR; normal S1S2 no murmurs, rubs, or gallops,no edema  Respiratory:  clear to auscultation bilaterally, normal work of breathing GI: soft, nontender, nondistended, + BS MS: no deformity or atrophy Skin: warm and dry, no rash Neuro:  Strength and sensation are intact Psych: normal   EKG:  EKG is not ordered today. The ekg ordered today demonstrates    Recent Labs: 01/29/2014: ALT 28; BUN 19; Creat 0.69; Potassium 4.7; Sodium 139; TSH 1.829    Lipid Panel    Component Value Date/Time   CHOL 177 01/29/2014 1222   TRIG 102 01/29/2014 1222   HDL 49 01/29/2014 1222   CHOLHDL 3.6 01/29/2014 1222   VLDL 20 01/29/2014 1222   LDLCALC 108* 01/29/2014 1222      Wt Readings from Last 3 Encounters:  07/10/14 57.208 kg (126 lb 1.9 oz)  04/12/14 58.423 kg (128 lb 12.8 oz)  03/19/14 59.875 kg (132 lb)      Other studies Reviewed: Additional studies/ records that were reviewed today include: . Review of the above records demonstrates:    ASSESSMENT AND PLAN:  1. Essential hypertension - we will add low-dose amlodipine-2.5 mg a day. Her blood pressure is well controlled at this point.  Will have her return to see her primary medical doctor .  I will see her as needed.   2. Sinus Bradycardia- her heart rate is now normal. Continue current medications.  2. Shortness of breath with exertion::  Echo is normal    Current medicines are reviewed at length with the patient today.  The patient does not have concerns regarding medicines.  The following changes have been made:  no change  Labs/ tests ordered today include:  No  orders of the defined types were placed in this encounter.    Disposition:   FU with her primary medical doctor.  Will see me as needed.    Signed, Franny Selvage, Deloris Ping, MD  07/10/2014 8:42 AM    [facility] [address] [place], [place], [place]  [address] Phone: [phone]; Fax: [phone]

## 2014-07-10 NOTE — Patient Instructions (Signed)
Medication Instructions:  Your physician recommends that you continue on your current medications as directed. Please refer to the Current Medication list given to you today.   Labwork: TODAY - basic metabolic panel   Testing/Procedures: None Ordered   Follow-Up: Your physician recommends that you schedule a follow-up appointment in: as needed with Dr. Elease Hashimoto

## 2014-07-24 ENCOUNTER — Ambulatory Visit: Payer: PRIVATE HEALTH INSURANCE | Attending: Family Medicine

## 2014-08-01 ENCOUNTER — Encounter: Payer: Self-pay | Admitting: Family Medicine

## 2014-08-01 ENCOUNTER — Ambulatory Visit: Payer: No Typology Code available for payment source | Attending: Family Medicine | Admitting: Family Medicine

## 2014-08-01 VITALS — BP 192/78 | HR 52 | Temp 98.4°F | Resp 16 | Ht 59.0 in | Wt 124.0 lb

## 2014-08-01 DIAGNOSIS — K088 Other specified disorders of teeth and supporting structures: Secondary | ICD-10-CM

## 2014-08-01 DIAGNOSIS — H269 Unspecified cataract: Secondary | ICD-10-CM

## 2014-08-01 DIAGNOSIS — Z87891 Personal history of nicotine dependence: Secondary | ICD-10-CM | POA: Insufficient documentation

## 2014-08-01 DIAGNOSIS — I10 Essential (primary) hypertension: Secondary | ICD-10-CM

## 2014-08-01 DIAGNOSIS — K089 Disorder of teeth and supporting structures, unspecified: Secondary | ICD-10-CM

## 2014-08-01 DIAGNOSIS — H547 Unspecified visual loss: Secondary | ICD-10-CM

## 2014-08-01 MED ORDER — AMLODIPINE BESYLATE 5 MG PO TABS: 5.0000 mg | ORAL_TABLET | Freq: Every day | ORAL | Status: DC

## 2014-08-01 NOTE — Assessment & Plan Note (Signed)
High BP today This is unusual for you  BMP done today to check kidney function Please increase amlodipine to 5 mg daily  Continue lisinopril 20-25 mg once daily

## 2014-08-01 NOTE — Progress Notes (Signed)
   Subjective:    Patient ID: Barbara Beck, female    DOB: [birth year], 81 y.o.   MRN: [id number] CC: dental referral  Spanish interpreter present  HPI 81 yo F presents with her daughter in law's cousin for f/u visit  1. R eye cataract: still having poor vision in R ear. No pain. No acute vision loss. Still awaiting appt with eye doctor.  2. Poor dentition: has partial lower dentures. Went to dentist, Aurea GraffGeorge Petrov, and was advised to have oral surgery. Does not have dental insurance. Does have orange card.   3. HTN: taking prinzide and amlodipine. Takes medicine at 6 pm. No CP, edema, vision loss, HA or worsening SOB.  Soc Hx: former smoker   Review of Systems  Constitutional: Negative for fever and chills.  HENT: Positive for dental problem.   Eyes: Positive for visual disturbance.  Respiratory: Negative for shortness of breath.   Cardiovascular: Negative for chest pain and leg swelling.  Gastrointestinal: Negative for abdominal pain and blood in stool.  Skin: Negative for rash.  Psychiatric/Behavioral: Negative for suicidal ideas and dysphoric mood.      Objective:   Physical Exam BP 192/78 mmHg  Pulse 52  Temp(Src) 98.4 F (36.9 C) (Oral)  Resp 16  Ht 4\' 11"  (1.499 m)  Wt 124 lb (56.246 kg)  BMI 25.03 kg/m2  SpO2 100%  BP Readings from Last 3 Encounters:  08/01/14 192/78  07/10/14 124/72  04/12/14 144/80    Wt Readings from Last 3 Encounters:  08/01/14 124 lb (56.246 kg)  07/10/14 126 lb 1.9 oz (57.208 kg)  04/12/14 128 lb 12.8 oz (58.423 kg)  General appearance: alert, cooperative and no distress  Throat; upper and lower dentures. Lower is partial with carries in all of her remaining teeth.  Lungs: clear to auscultation bilaterally Heart: regular rate and rhythm, S1, S2 normal, no murmur, click, rub or gallop Extremities: extremities normal, atraumatic, no cyanosis or edema       Assessment & Plan:

## 2014-08-01 NOTE — Patient Instructions (Addendum)
Ms. Barbara Beck,  Thank you for coming in today  1. In need of oral surgery:  Referral placed to oral surgery   2. High BP today This is unusual for you  BMP done today to check kidney function Please increase amlodipine to 5 mg daily  Continue lisinopril 20-25 mg once daily   3. R eye cataract: ophthalmology referral has been placed again  F/u in 1 week with RN for BP check See me in 6 weeks  Dr. Armen PickupFunches

## 2014-08-01 NOTE — Assessment & Plan Note (Signed)
In need of oral surgery:  Referral placed to oral surgery

## 2014-08-01 NOTE — Progress Notes (Signed)
F/U HTN Referral Dental and vision  Pt stated taking BP medication about 6 Pm daily  No HX tobacco

## 2014-08-02 ENCOUNTER — Other Ambulatory Visit: Payer: Self-pay | Admitting: Family Medicine

## 2014-08-02 ENCOUNTER — Telehealth: Payer: Self-pay | Admitting: Family Medicine

## 2014-08-02 LAB — BASIC METABOLIC PANEL
BUN: 10 mg/dL (ref 6–23)
CHLORIDE: 101 meq/L (ref 96–112)
CO2: 30 mEq/L (ref 19–32)
Calcium: 9.7 mg/dL (ref 8.4–10.5)
Creat: 0.77 mg/dL (ref 0.50–1.10)
Glucose, Bld: 82 mg/dL (ref 70–99)
Potassium: 5.3 mEq/L (ref 3.5–5.3)
Sodium: 145 mEq/L (ref 135–145)

## 2014-08-02 NOTE — Telephone Encounter (Signed)
-----   Message from Dessa PhiJosalyn Funches, MD sent at 08/02/2014  8:49 AM EDT ----- Normal BMP

## 2014-08-02 NOTE — Telephone Encounter (Signed)
Aware of results. 

## 2014-08-02 NOTE — Telephone Encounter (Signed)
Pt received a call from you and is calling back to check on the status of her dental referral.

## 2014-08-05 NOTE — Telephone Encounter (Signed)
Pt's daughter in law cecilia is aware of pt's appointment 08-26-14 2 12:30pm Dr Marilynne HalstedJansen Scott

## 2014-08-08 ENCOUNTER — Ambulatory Visit: Payer: No Typology Code available for payment source | Attending: Family Medicine | Admitting: Pharmacist

## 2014-08-08 ENCOUNTER — Encounter: Payer: Self-pay | Admitting: Pharmacist

## 2014-08-08 VITALS — BP 158/74 | HR 54

## 2014-08-08 DIAGNOSIS — I10 Essential (primary) hypertension: Secondary | ICD-10-CM

## 2014-08-08 DIAGNOSIS — Z87891 Personal history of nicotine dependence: Secondary | ICD-10-CM | POA: Insufficient documentation

## 2014-08-08 MED ORDER — AMLODIPINE BESYLATE 10 MG PO TABS: 10.0000 mg | ORAL_TABLET | Freq: Every day | ORAL | Status: AC

## 2014-08-08 MED ORDER — ACETAMINOPHEN-CODEINE #3 300-30 MG PO TABS: 1.0000 | ORAL_TABLET | Freq: Three times a day (TID) | ORAL | Status: DC | PRN

## 2014-08-08 NOTE — Progress Notes (Signed)
S:    Patient arrives in good spirits with her daughter-in-law. Patient is Spanish-speaking and interpreter was present for the visit. She presents to the clinic for blood pressure evaluation.   Her daughter in law reports that the patient had been out of her lisinopril-HCTZ at the last visit and that was probably why her blood pressure was so high.  Patient also reports stress as transportation services did not pick her up. She also has tooth pain. She was evaluated by Dr. Armen Pickup for this and has been referred to an oral surgeon.   Patient reports taking her blood pressure medications about 6 PM every day. She typically goes to bed around 8 PM.  She reports that she is concerned about the possibility of a stroke with high blood pressure. She is very religious and using that as a support.  Current BP Medications include:  Lisinopril-HCTZ and amlodipine 5 mg daily   O:   Last 3 Office BP readings: BP Readings from Last 3 Encounters:  08/08/14 158/74  08/01/14 192/78  07/10/14 124/72     BMET    Component Value Date/Time   NA 145 08/01/2014 1638   K 5.3 08/01/2014 1638   CL 101 08/01/2014 1638   CO2 30 08/01/2014 1638   GLUCOSE 82 08/01/2014 1638   BUN 10 08/01/2014 1638   CREATININE 0.77 08/01/2014 1638   CREATININE 0.80 07/10/2014 0857   CALCIUM 9.7 08/01/2014 1638   GFRNONAA 80 01/29/2014 1222   GFRAA >89 01/29/2014 1222    A/P: Hypertension: currently uncontrolled based on blood pressure of 158/74 but under improved control. Patient goal is <150/90. Discussed case with Dr. Armen Pickup and under her direction increased amlodipine to 10 mg daily. She prescribed Tylenol #3 for tooth pain. Counseled patient and daughter in law on the new medications, including side effects and administration. Patient has a referral in with oral surgery. Results reviewed and written information provided.   F/U Clinic Visit for blood pressure check in 1 week.   Total time in face-to-face counseling  30 minutes.

## 2014-08-08 NOTE — Patient Instructions (Signed)
It was great to meet you today!  Increase your amlodipine to 10 mg daily. You can take 2 tablets of the 5 mg for now.  Pick up the Tylenol #3 for tooth pain.  Come back and see me in a week .

## 2014-08-08 NOTE — Assessment & Plan Note (Signed)
Hypertension: currently uncontrolled based on blood pressure of 158/74 but under improved control. Patient goal is <150/90. Discussed case with Dr. Armen Pickup and under her direction increased amlodipine to 10 mg daily. She prescribed Tylenol #3 for tooth pain. Counseled patient and daughter in law on the new medications, including side effects and administration. Patient has a referral in with oral surgery. Results reviewed and written information provided.   F/U Clinic Visit for blood pressure check in 1 week.   Total time in face-to-face counseling 30 minutes.

## 2014-08-08 NOTE — Addendum Note (Signed)
Addended by: Juanita Craver A on: 08/08/2014 03:59 PM   Modules accepted: Level of Service

## 2014-08-15 ENCOUNTER — Ambulatory Visit: Payer: No Typology Code available for payment source | Attending: Family Medicine | Admitting: Pharmacist

## 2014-08-15 VITALS — BP 130/70 | HR 60

## 2014-08-15 DIAGNOSIS — I10 Essential (primary) hypertension: Secondary | ICD-10-CM

## 2014-08-15 DIAGNOSIS — Z87891 Personal history of nicotine dependence: Secondary | ICD-10-CM | POA: Insufficient documentation

## 2014-08-15 NOTE — Patient Instructions (Addendum)
Hipertensin (Hypertension) La hipertensin, conocida comnmente como presin arterial alta, se produce cuando la sangre bombea en las arterias con mucha fuerza. Las arterias son los vasos sanguneos que transportan la sangre desde el corazn hacia todas las partes del cuerpo. Una lectura de la presin arterial consiste en un nmero ms alto sobre un nmero ms bajo, por ejemplo, 110/72. El nmero ms alto (presin sistlica) corresponde a la presin interna de las arterias cuando el corazn [place]. El nmero ms bajo (presin diastlica) corresponde a la presin interna de las arterias cuando el corazn se relaja. En condiciones ideales, la presin arterial debe ser inferior a 120/80. La hipertensin fuerza al corazn a trabajar ms para Herbalist. Las arterias pueden estrecharse o ponerse rgidas. La hipertensin conlleva el riesgo de enfermedad cardaca, ictus y otros problemas.  [place] de riesgo de hipertensin son controlables, pero otros no lo son.  [employer] factores de riesgo que usted no puede Chief Technology Officer, se incluyen:   Manufacturing systems engineer. El riesgo es mayor para las Retail banker.  La edad. Los riesgos aumentan con la edad.  El sexo. Antes de los 45aos, los hombres corren ms [employer]. Despus de los 65aos, las mujeres corren ms [employer]. Entre los factores de riesgo que usted puede Chief Technology Officer, se incluyen:  No hacer la cantidad suficiente de actividad fsica o ejercicio.  Tener sobrepeso.  Consumir mucha grasa, azcar, caloras o sal en la dieta.  Beber alcohol en exceso. SIGNOS Y SNTOMAS Por lo general, la hipertensin no causa signos o sntomas. La hipertensin demasiado alta (crisis hipertensiva) puede causar dolor de cabeza, ansiedad, falta de aire y hemorragia nasal. DIAGNSTICO  Para detectar si usted tiene hipertensin, el mdico le medir la presin arterial mientras est sentado, con el brazo  levantado a la altura del corazn. Debe medirla al [facility] veces en el mismo brazo. Determinadas condiciones pueden causar una diferencia de presin arterial entre el brazo izquierdo y Insurance underwriter. El hecho de tener una sola lectura de la presin arterial ms alta que lo normal no significa que Stage manager. En el caso de tener una lectura de la presin arterial con un valor alto, pdale al mdico que la verifique nuevamente. [place] hipertensin arterial incluye hacer cambios en el estilo de vida y, posiblemente, tomar medicamentos. Un estilo de vida saludable puede ayudar a bajar la presin arterial alta. Quiz deba cambiar algunos hbitos. Los [employer] en el estilo de vida pueden incluir:  Seguir la dieta DASH. Esta dieta tiene un alto contenido de frutas, verduras y Psychologist, prison and probation services. Incluye poca cantidad de sal, carnes rojas y azcares agregados.  Hacer al menos 2horas de actividad fsica enrgica todas las semanas.  Perder peso, si es necesario.  No fumar.  Limitar el consumo de bebidas alcohlicas.  Aprender formas de reducir el estrs. Si los cambios en el estilo de vida no son suficientes para Child psychotherapist la presin arterial, el mdico puede recetarle medicamentos. Quiz necesite tomar ms de uno. Trabaje en conjunto con su mdico para comprender los riesgos y los beneficios. INSTRUCCIONES PARA EL CUIDADO EN EL HOGAR  Haga que le midan de nuevo la presin arterial segn las indicaciones del [place] los medicamentos solamente como se lo haya indicado el mdico. Siga cuidadosamente las indicaciones. Los medicamentos para la presin arterial deben tomarse segn las indicaciones. Los medicamentos pierden eficacia al omitir las dosis. El hecho de omitir  las dosis tambin aumenta el riesgo de otros problemas.  No fume.  Contrlese la presin arterial en su casa segn las indicaciones del mdico. SOLICITE ATENCIN MDICA SI:   Piensa  que tiene una reaccin alrgica a los medicamentos.  Tiene mareos o dolores de cabeza con recurrencia.  Tiene hinchazn en los tobillos.  Tiene problemas de visin. SOLICITE ATENCIN MDICA DE INMEDIATO SI:  Siente un dolor de cabeza intenso o confusin.  Siente debilidad inusual, adormecimiento o que se desmayar.  Siente dolor intenso en el pecho o en el abdomen.  Vomita repetidas veces.  Tiene dificultad para respirar. ASEGRESE DE QUE:   Comprende estas instrucciones.  Controlar su afeccin.  Recibir ayuda de inmediato si no mejora o si empeora. Document Released: 01/04/2005 Document Revised: 05/21/2013 ExitCare Patient Information 2015 ExitCare, [employer]. This information is not intended to replace advice given to you by your health care provider. Make sure you discuss any questions you have with your health care provider. Plan de alimentacin DASH (DASH Eating Plan) DASH es la sigla en ingls de "Enfoques Alimentarios para Detener la Hipertensin". El plan de alimentacin DASH ha demostrado bajar la presin arterial elevada (hipertensin). Los beneficios adicionales para la salud pueden incluir la disminucin del riesgo de diabetes mellitus tipo2, enfermedades cardacas e ictus. Este plan tambin puede ayudar a adelgazar. QU DEBO SABER ACERCA DEL PLAN DE ALIMENTACIN DASH? Para el plan de alimentacin DASH, seguir las siguientes pautas generales:  Elija los alimentos con un valor porcentual diario de sodio de menos del 5% (segn figura en la etiqueta del alimento).  Use hierbas o aderezos sin sal, en lugar de sal de mesa o sal marina.  Consulte al mdico o farmacutico antes de usar sustitutos de la sal.  Coma productos con bajo contenido de sodio, cuya etiqueta suele decir "bajo contenido de sodio" o "sin agregado de sal".  Coma alimentos frescos.  Coma ms verduras, frutas y productos lcteos con bajo contenido de grasas.  Elija los cereales integrales. Busque  la palabra "integral" en el primer lugar de la lista de ingredientes.  Elija el pescado y el pollo o el pavo sin piel ms a menudo que las carnes rojas. Limite el consumo de pescado, carne de ave y carne a 6onzas (170g) por da.  Limite el consumo de dulces, postres, azcares y bebidas azucaradas.  Elija las grasas saludables para el corazn.  Limite el consumo de queso a 1onza (28g) por da.  Consuma ms comida casera y menos de restaurante, de buf y comida rpida.  Limite el consumo de alimentos fritos.  Cocine los alimentos utilizando mtodos que no sean la fritura.  Limite las verduras enlatadas. Si las consume, enjuguelas bien para disminuir el sodio.  Cuando coma en un restaurante, pida que preparen su comida con menos sal o, en lo posible, sin nada de sal. QU ALIMENTOS PUEDO COMER? Pida ayuda a un nutricionista para conocer las necesidades calricas individuales. Cereales Pan de salvado o integral. Arroz integral. Pastas de salvado o integrales. Quinua, trigo burgol y cereales integrales. Cereales con bajo contenido de sodio. Tortillas de harina de maz o de salvado. Pan de maz integral. Galletas saladas integrales. Galletas con bajo contenido de sodio. Vegetales Verduras frescas o congeladas (crudas, al vapor, asadas o grilladas). Jugos de tomate y verduras con contenido bajo o reducido de sodio. Pasta y salsa de tomate con contenido bajo o reducido de sodio. Verduras enlatadas con bajo contenido de sodio o reducido de sodio.  Frutas Frutas frescas,   en conserva (en su jugo natural) o frutas congeladas. Carnes y otros productos con protenas Carne de res molida (al 85% o ms magra), carne de res de animales alimentados con pastos o carne de res sin la grasa. Pollo o pavo sin piel. Carne de pollo o de pavo molida. Cerdo sin la grasa. Todos los pescados y frutos de mar. Huevos. Porotos, guisantes o lentejas secos. Frutos secos y semillas sin sal. Frijoles enlatados sin  sal. Lcteos Productos lcteos con bajo contenido de grasas, como leche descremada o al 1%, quesos reducidos en grasas o al 2%, ricota con bajo contenido de grasas o queso cottage, o yogur natural con bajo contenido de grasas. Quesos con contenido bajo o reducido de sodio. Grasas y aceites Margarinas en barra que no contengan grasas trans. [place] y alios para ensaladas livianos o reducidos en grasas (reducidos en sodio). [place]. Aceites de crtamo, oliva o canola. Mantequilla natural de man o almendra. Otros Palomitas de maz y pretzels sin sal. Los artculos mencionados arriba pueden no ser una lista completa de las bebidas o los alimentos recomendados. Comunquese con el nutricionista para conocer ms opciones. QU ALIMENTOS NO SE RECOMIENDAN? Cereales Pan blanco. Pastas blancas. Arroz blanco. Pan de maz refinado. Bagels y croissants. Galletas saladas que contengan grasas trans. Vegetales Vegetales con crema o fritos. Verduras en salsa de queso. Verduras enlatadas comunes. Pasta y salsa de tomate en lata comunes. Jugos comunes de tomate y de verduras. Frutas Frutas secas. Fruta enlatada en almbar liviano o espeso. Jugo de frutas. Carnes y otros productos con protenas [place] de carne con grasa. Costillas, alas de pollo, tocineta, salchicha, mortadela, salame, chinchulines, tocino, perros calientes, salchichas alemanas y embutidos envasados. Frutos secos y semillas con sal. Frijoles con sal en lata. Lcteos Leche entera o al 2%, crema, mezcla de leche y crema, y queso crema. Yogur entero o endulzado. Quesos o queso azul con alto contenido de grasas. [place] no lcteas y coberturas batidas. Quesos procesados, quesos para untar o cuajadas. Condimentos Sal de cebolla y ajo, sal condimentada, sal de mesa y sal marina. Salsas en lata y envasadas. Salsa [place]. Salsa trtara. Salsa barbacoa. Salsa teriyaki. Salsa de soja, incluso la que tiene contenido reducido de sodio. Salsa de  carne. Salsa de pescado. Salsa de ostras. Salsa rosada. Rbano picante. Ketchup y mostaza. Saborizantes y tiernizantes para carne. Caldo en cubitos. Salsa picante. Salsa tabasco. Adobos. Aderezos para tacos. Salsas. Grasas y aceites Mantequilla, margarina en barra, manteca de cerdo, grasa, mantequilla clarificada y grasa de tocino. Aceites de coco, de palmiste o de palma. Aderezos comunes para ensalada. Otros Pickles y aceitunas. Palomitas de maz y pretzels con sal. Los artculos mencionados arriba pueden no ser una lista completa de las bebidas y los alimentos que se deben evitar. Comunquese con el nutricionista para obtener ms informacin. DNDE PUEDO ENCONTRAR MS INFORMACIN? Instituto Nacional del Corazn, del Pulmn y de la Sangre (National Heart, Lung, and Blood Institute): www.nhlbi.nih.gov/health/health-topics/topics/dash/ Document Released: 12/24/2010 Document Revised: 05/21/2013 ExitCare Patient Information 2015 ExitCare, LLC. This information is not intended to replace advice given to you by your health care provider. Make sure you discuss any questions you have with your health care provider.  

## 2014-08-15 NOTE — Progress Notes (Signed)
S:    Patient arrives in good spirits with her daughter-in-law. Patient is Spanish-speaking and interpreter was present for the visit. She presents to the clinic for blood pressure evaluation.   Patient reports that she feel better but continues to have some tooth pain. She also reports that she feels very tired all of the time and her vision gets worse every day.   Patient is scheduled to see the oral surgeon next week.   Current BP Medications include:  Lisinopril-HCTZ and amlodipine 10 mg daily   O:   Last 3 Office BP readings: BP Readings from Last 3 Encounters:  08/15/14 130/70  08/08/14 158/74  08/01/14 192/78     BMET    Component Value Date/Time   NA 145 08/01/2014 1638   K 5.3 08/01/2014 1638   CL 101 08/01/2014 1638   CO2 30 08/01/2014 1638   GLUCOSE 82 08/01/2014 1638   BUN 10 08/01/2014 1638   CREATININE 0.77 08/01/2014 1638   CREATININE 0.80 07/10/2014 0857   CALCIUM 9.7 08/01/2014 1638   GFRNONAA 80 01/29/2014 1222   GFRAA >89 01/29/2014 1222    A/P: Hypertension: currently controlled based on blood pressure of 130/70. Continue amlodipine 10 mg daily and lisinopril/HCTZ.  Results reviewed and written information provided (in Spanish) about hypertension and DASH diet. Patient could be tired due to taking the codeine in the Tylenol #3 so I told her that she didn't have to take it if she didn't have pain. Oral surgery appointment is next week, patient will follow up for cataract surgery after oral surgery.  F/U with Dr. Armen Pickup as needed. Total time in face-to-face counseling 20 minutes.

## 2014-08-27 ENCOUNTER — Emergency Department (HOSPITAL_COMMUNITY)
Admission: EM | Admit: 2014-08-27 | Discharge: 2014-08-27 | Disposition: A | Discharge status: Home / Self Care | Payer: Self-pay | Attending: Emergency Medicine | Admitting: Emergency Medicine

## 2014-08-27 ENCOUNTER — Encounter (HOSPITAL_COMMUNITY): Payer: Self-pay | Admitting: Physical Medicine and Rehabilitation

## 2014-08-27 DIAGNOSIS — K0889 Other specified disorders of teeth and supporting structures: Secondary | ICD-10-CM

## 2014-08-27 DIAGNOSIS — Z79899 Other long term (current) drug therapy: Secondary | ICD-10-CM | POA: Insufficient documentation

## 2014-08-27 DIAGNOSIS — K219 Gastro-esophageal reflux disease without esophagitis: Secondary | ICD-10-CM | POA: Insufficient documentation

## 2014-08-27 DIAGNOSIS — Z87891 Personal history of nicotine dependence: Secondary | ICD-10-CM | POA: Insufficient documentation

## 2014-08-27 DIAGNOSIS — Z7951 Long term (current) use of inhaled steroids: Secondary | ICD-10-CM | POA: Insufficient documentation

## 2014-08-27 DIAGNOSIS — I10 Essential (primary) hypertension: Secondary | ICD-10-CM | POA: Insufficient documentation

## 2014-08-27 DIAGNOSIS — K088 Other specified disorders of teeth and supporting structures: Secondary | ICD-10-CM | POA: Insufficient documentation

## 2014-08-27 NOTE — Discharge Instructions (Signed)
Dolor dental (Dental Pain) Usted ha consultado con el profesional que lo asiste porque sufre dolor en un diente. ste puede estar ocasionado en caries dentales, las que exponen el nervio del diente al aire, y a temperaturas fras o calientes, lo que Sports administrator. Puede provenir de una infeccin o absceso (tambin denominado fornculo) alrededor del diente, que tambin suele ser causado por caries dentales. Esto produce el dolor que usted siente. DIAGNSTICO El profesional que lo asiste puede diagnosticar el problema con un examen bucal. TRATAMIENTO  Si la causa es una infeccin, puede tratarse con antibiticos (medicamentos que combaten grmenes) y Media planner que Corporate investment banker, como le ha recetado el profesional que lo asiste. Tome la medicacin como se le indic.  Utilice los medicamentos de venta libre o de prescripcin para Chief Technology Officer, Environmental health practitioner o la [employer], segn se lo indique el profesional que lo asiste.  Debido a que Nurse, adult generalmente es causado por infeccin o por una enfermedad dental, es aconsejable que acuda a su dentista lo antes posible para que le realice un tratamiento ms profundo. SOLICITE ATENCIN MDICA DE INMEDIATO SI: El examen y el tratamiento que recibi hoy fue indicado slo para hacer frente a la Teaching laboratory technician. No constituye un sustituto de la atencin mdica o Musician. Si su problema empeora o surgen nuevos sntomas (problemas) y no puede concertar una cita con su odontlogo para un pronto seguimiento, llame o acuda nuevamente a Educational psychologist. SOLICITE ATENCIN MDICA DE INMEDIATO SI:  Tiene fiebre.  Presenta enrojecimiento e hinchazn en el rostro, la mandbula o el cuello.  No puede abrir Government social research officer.  Siente un dolor intenso que no puede ser [employer]. EST SEGURO QUE:   Comprende las instrucciones para el alta mdica.  Controlar su enfermedad.  Solicitar atencin mdica de inmediato segn las indicaciones. Document  Released: 01/04/2005 Document Revised: 03/29/2011 [facility] Patient Information 2015 [place], [place]. This information is not intended to replace advice given to you by your health care provider. Make sure you discuss any questions you have with your health care provider.  Please follow-up with resources provided, please monitor for new or worsening signs or symptoms return immediately if any present.

## 2014-08-27 NOTE — ED Notes (Signed)
Pt presents to department for evaluation of lower dental pain. Ongoing for several weeks. Reports she was seen by dentist, but unable to get teeth extracted because she doesn't have insurance. 8/10 pain upon arrival to ED. NAD

## 2014-08-27 NOTE — ED Provider Notes (Signed)
CSN: [id number]   Arrival date & time 08/27/14 1553  History  This chart was scribed for non-physician practitioner, Eyvonne Mechanic PA-C , working with Glynn Octave, MD by Bethel Born, ED Scribe. This patient was seen in room TR07C/TR07C and the patient's care was started at 5:23 PM.  Chief Complaint  Patient presents with  . Dental Pain    HPI The history is provided by the patient and a relative. A language interpreter was used.   Barbara Beck is a 81 y.o. female who presents to the Emergency Department complaining of constant bilateral lower dental pain with onset several weeks ago. She rates the pain 8/10 in severity. Pt has been seen by a dentist and told that she has 5 teeth that need to be removed by an oral surgeon. After that visit she completed a course of antibiotics. She has not been able to f/u with an oral surgeon because she has no dental insurance or income. Pt has an orange card but is unable to be helped because she has only been in this country for 3 years. The pt is from the [place] and speaks little [place]. Her daughter-in- law is a the bedside providing the history. Patient denies fever, chills, nausea or vomiting. She denies pain to the jaw or neck. She has no difficulty speaking swallowing.   Past Medical History  Diagnosis Date  . Hypertension ?   Marland Kitchen GERD (gastroesophageal reflux disease) ?     Past Surgical History  Procedure Laterality Date  . Eye surgery Left     Cataract Surgery     Family History  Problem Relation Age of Onset  . Cancer Daughter     breast with bone mets   . Hypertension Father     Social History  Substance Use Topics  . Smoking status: Former Smoker -- 2.00 packs/day for 13 years    Quit date: 01/19/1984  . Smokeless tobacco: Never Used  . Alcohol Use: No     Review of Systems  All other systems reviewed and are negative.   Home Medications   Prior to Admission medications   Medication Sig Start Date End  Date Taking? Authorizing Provider  acetaminophen (TYLENOL) 325 MG tablet Take 2 tablets (650 mg total) by mouth 3 (three) times daily as needed for moderate pain. 03/11/14   Josalyn Funches, MD  acetaminophen-codeine (TYLENOL #3) 300-30 MG per tablet Take 1 tablet by mouth every 8 (eight) hours as needed for moderate pain or severe pain. 08/08/14   Josalyn Funches, MD  amLODipine (NORVASC) 10 MG tablet Take 1 tablet (10 mg total) by mouth daily. 08/08/14   Josalyn Funches, MD  Calcium Carb-Cholecalciferol (CALCIUM-VITAMIN D) 600-400 MG-UNIT TABS Take 1 tablet by mouth daily.     Historical Provider, MD  cyanocobalamin 500 MCG tablet Take 1 tablet (500 mcg total) by mouth daily. 02/28/14   Josalyn Funches, MD  fluticasone (FLONASE) 50 MCG/ACT nasal spray Place 2 sprays into both nostrils daily. 01/29/14   Josalyn Funches, MD  lisinopril-hydrochlorothiazide (PRINZIDE,ZESTORETIC) 20-25 MG per tablet Take 1 tablet by mouth daily. 03/19/14   Josalyn Funches, MD  pyridOXINE (B-6) 50 MG tablet Take 1 tablet (50 mg total) by mouth daily. 02/28/14   Josalyn Funches, MD  ranitidine (ZANTAC) 150 MG tablet TAKE 1 TABLET BY MOUTH 2 TIMES DAILY 08/02/14   Dessa Phi, MD  Thiamine 50 MG CAPS Take 1 capsule (50 mg total) by mouth daily. 02/28/14   Dessa Phi, MD  Allergies  Aspirin  Triage Vitals: BP 139/55 mmHg  Pulse 66  Temp(Src) 98.5 F (36.9 C) (Oral)  Resp 18  SpO2 100%  Physical Exam  Constitutional: She is oriented to person, place, and time. She appears well-developed and well-nourished.  HENT:  Head: Normocephalic.  Multiple teeth removed, Pt wears dentures  4 remaining on the bottom, cracked tooth on the right, no signs of infections, gumline non tender to palpation Face is symmetric Jaw non-tender to palpation, no obvious swelling  Eyes: EOM are normal.  Neck: Normal range of motion. Neck supple.  Neck non-tender Full active ROM  Pulmonary/Chest: Effort normal.  Abdominal: She  exhibits no distension.  Musculoskeletal: Normal range of motion.  Neurological: She is alert and oriented to person, place, and time.  Psychiatric: She has a normal mood and affect.  Nursing note and vitals reviewed.   ED Course  Procedures  Labs Review- Labs Reviewed - No data to display  Imaging Review No results found.  EKG Interpretation None      MDM   Final diagnoses:  Pain, dental     Labs  Imaging:   Consults  Burna Mortimer from Case Management spoke with the pt and gave her dental resources.  Therapeutics:   Assessment: Patient presents with dental pain. She has 4 remaining teeth in the lower jaw. One cracked, no signs of infection., Floor mouth is soft nontender. She was consult and who provider resources for patient follow-up. Patient verbalized understanding and agreement for today's plan and had no further questions or concerns at this time discharge. Strict return precautions given.  Plan:  I personally performed the services described in this documentation, which was scribed in my presence. The recorded information has been reviewed and is accurate.      Eyvonne Mechanic, PA-C 08/28/14 1727  Glynn Octave, MD 08/29/14 [phone]

## 2014-09-19 ENCOUNTER — Encounter (HOSPITAL_COMMUNITY): Payer: Self-pay | Admitting: *Deleted

## 2014-09-19 ENCOUNTER — Emergency Department (HOSPITAL_COMMUNITY)
Admission: EM | Admit: 2014-09-19 | Discharge: 2014-09-19 | Disposition: A | Discharge status: Home / Self Care | Payer: Self-pay | Attending: Emergency Medicine | Admitting: Emergency Medicine

## 2014-09-19 ENCOUNTER — Emergency Department (HOSPITAL_COMMUNITY): Payer: Self-pay

## 2014-09-19 DIAGNOSIS — N390 Urinary tract infection, site not specified: Secondary | ICD-10-CM | POA: Insufficient documentation

## 2014-09-19 DIAGNOSIS — R5383 Other fatigue: Secondary | ICD-10-CM | POA: Insufficient documentation

## 2014-09-19 DIAGNOSIS — I10 Essential (primary) hypertension: Secondary | ICD-10-CM | POA: Insufficient documentation

## 2014-09-19 DIAGNOSIS — K219 Gastro-esophageal reflux disease without esophagitis: Secondary | ICD-10-CM | POA: Insufficient documentation

## 2014-09-19 DIAGNOSIS — Z79899 Other long term (current) drug therapy: Secondary | ICD-10-CM | POA: Insufficient documentation

## 2014-09-19 DIAGNOSIS — Z87891 Personal history of nicotine dependence: Secondary | ICD-10-CM | POA: Insufficient documentation

## 2014-09-19 DIAGNOSIS — Z7951 Long term (current) use of inhaled steroids: Secondary | ICD-10-CM | POA: Insufficient documentation

## 2014-09-19 LAB — COMPREHENSIVE METABOLIC PANEL
ALT: 17 U/L (ref 14–54)
AST: 23 U/L (ref 15–41)
Albumin: 3.5 g/dL (ref 3.5–5.0)
Alkaline Phosphatase: 91 U/L (ref 38–126)
Anion gap: 6 (ref 5–15)
BILIRUBIN TOTAL: 0.6 mg/dL (ref 0.3–1.2)
BUN: 7 mg/dL (ref 6–20)
CO2: 31 mmol/L (ref 22–32)
CREATININE: 0.8 mg/dL (ref 0.44–1.00)
Calcium: 8.9 mg/dL (ref 8.9–10.3)
Chloride: 101 mmol/L (ref 101–111)
GFR calc Af Amer: >60 mL/min (ref >60)
GLUCOSE: 106 mg/dL — AB (ref 65–99)
Potassium: 3.6 mmol/L (ref 3.5–5.1)
Sodium: 138 mmol/L (ref 135–145)
TOTAL PROTEIN: 6.8 g/dL (ref 6.5–8.1)

## 2014-09-19 LAB — CBG MONITORING, ED: Glucose-Capillary: 99 mg/dL (ref 65–99)

## 2014-09-19 LAB — BRAIN NATRIURETIC PEPTIDE: B Natriuretic Peptide: 30.1 pg/mL (ref 0.0–100.0)

## 2014-09-19 LAB — CBC
HCT: 42 % (ref 36.0–46.0)
Hemoglobin: 14 g/dL (ref 12.0–15.0)
MCH: 29.5 pg (ref 26.0–34.0)
MCHC: 33.3 g/dL (ref 30.0–36.0)
MCV: 88.6 fL (ref 78.0–100.0)
PLATELETS: 331 10*3/uL (ref 150–400)
RBC: 4.74 MIL/uL (ref 3.87–5.11)
RDW: 12.8 % (ref 11.5–15.5)
WBC: 8.9 10*3/uL (ref 4.0–10.5)

## 2014-09-19 LAB — URINALYSIS, ROUTINE W REFLEX MICROSCOPIC
BILIRUBIN URINE: NEGATIVE
Glucose, UA: NEGATIVE mg/dL
HGB URINE DIPSTICK: NEGATIVE
KETONES UR: NEGATIVE mg/dL
NITRITE: NEGATIVE
PH: 7 (ref 5.0–8.0)
Protein, ur: NEGATIVE mg/dL
Specific Gravity, Urine: 1.012 (ref 1.005–1.030)
UROBILINOGEN UA: 1 mg/dL (ref 0.0–1.0)

## 2014-09-19 LAB — URINE MICROSCOPIC-ADD ON

## 2014-09-19 LAB — LIPASE, BLOOD: Lipase: 20 U/L — ABNORMAL LOW (ref 22–51)

## 2014-09-19 MED ORDER — DEXTROSE 5 % IV SOLN
1.0000 g | Freq: Once | INTRAVENOUS | Status: AC
  Administered 2014-09-19: 1 g via INTRAVENOUS
  Filled 2014-09-19: qty 10

## 2014-09-19 MED ORDER — CEPHALEXIN 500 MG PO CAPS: 500.0000 mg | ORAL_CAPSULE | Freq: Two times a day (BID) | ORAL | Status: AC

## 2014-09-19 MED ORDER — SODIUM CHLORIDE 0.9 % IV SOLN
Freq: Once | INTRAVENOUS | Status: AC
  Administered 2014-09-19: 23:00:00 via INTRAVENOUS

## 2014-09-19 NOTE — ED Notes (Signed)
CBG result was 99. Informed RN.

## 2014-09-19 NOTE — ED Notes (Signed)
Spanish speaking only.  Pt has had diarrhea for months, but family states diarrhea is getting worse, like she cannot finish a meal without running to the bathroom.  Family states pt is also much weaker than normal.

## 2014-09-19 NOTE — ED Provider Notes (Signed)
CSN: [id number]     Arrival date & time 09/19/14  1842 History   First MD Initiated Contact with Patient 09/19/14 1916     Chief Complaint  Patient presents with  . Shortness of Breath    HPI  Patient presents with family who assists with the history of present illness. Patient's family member states that the patient has haddecreased energy for days to weeks, with new difficulty with minimal activity. There is questionable dyspnea, but no recent cough. Family also has increasingly frequent diarrhea, loose stool. Patient has been evaluated for her diarrhea by primary care, including stool cultures. No clear etiology has been found. Patient is generally well. Patient is from the [place], moved here within the past year to join family members.    Past Medical History  Diagnosis Date  . Hypertension ?   Marland Kitchen GERD (gastroesophageal reflux disease) ?    Past Surgical History  Procedure Laterality Date  . Eye surgery Left     Cataract Surgery    Family History  Problem Relation Age of Onset  . Cancer Daughter     breast with bone mets   . Hypertension Father    Social History  Substance Use Topics  . Smoking status: Former Smoker -- 2.00 packs/day for 13 years    Quit date: 01/19/1984  . Smokeless tobacco: Never Used  . Alcohol Use: No   OB History    No data available     Review of Systems  Constitutional:       Per HPI, otherwise negative  HENT:       Per HPI, otherwise negative  Respiratory:       Per HPI, otherwise negative  Cardiovascular:       Per HPI, otherwise negative  Gastrointestinal: Positive for diarrhea. Negative for vomiting.  Endocrine:       Negative aside from HPI  Genitourinary:       Neg aside from HPI   Musculoskeletal:       Per HPI, otherwise negative  Skin: Negative.   Neurological: Negative for syncope.      Allergies  Aspirin  Home Medications   Prior to Admission medications   Medication Sig Start Date End Date  Taking? Authorizing Provider  acetaminophen (TYLENOL) 325 MG tablet Take 2 tablets (650 mg total) by mouth 3 (three) times daily as needed for moderate pain. 03/11/14   Josalyn Funches, MD  acetaminophen-codeine (TYLENOL #3) 300-30 MG per tablet Take 1 tablet by mouth every 8 (eight) hours as needed for moderate pain or severe pain. 08/08/14   Josalyn Funches, MD  amLODipine (NORVASC) 10 MG tablet Take 1 tablet (10 mg total) by mouth daily. 08/08/14   Josalyn Funches, MD  Calcium Carb-Cholecalciferol (CALCIUM-VITAMIN D) 600-400 MG-UNIT TABS Take 1 tablet by mouth daily.     Historical Provider, MD  cyanocobalamin 500 MCG tablet Take 1 tablet (500 mcg total) by mouth daily. 02/28/14   Josalyn Funches, MD  fluticasone (FLONASE) 50 MCG/ACT nasal spray Place 2 sprays into both nostrils daily. 01/29/14   Josalyn Funches, MD  lisinopril-hydrochlorothiazide (PRINZIDE,ZESTORETIC) 20-25 MG per tablet Take 1 tablet by mouth daily. 03/19/14   Josalyn Funches, MD  pyridOXINE (B-6) 50 MG tablet Take 1 tablet (50 mg total) by mouth daily. 02/28/14   Josalyn Funches, MD  ranitidine (ZANTAC) 150 MG tablet TAKE 1 TABLET BY MOUTH 2 TIMES DAILY 08/02/14   Dessa Phi, MD  Thiamine 50 MG CAPS Take 1 capsule (50 mg total)  by mouth daily. 02/28/14   Josalyn Funches, MD   BP 163/68 mmHg  Pulse 55  Temp(Src) 98.6 F (37 C) (Oral)  Resp 12  Ht 4\' 11"  (1.499 m)  Wt 124 lb (56.246 kg)  BMI 25.03 kg/m2  SpO2 97% Physical Exam  Constitutional: She is oriented to person, place, and time. She appears well-developed and well-nourished. No distress.  HENT:  Head: Normocephalic and atraumatic.  Eyes: Conjunctivae and EOM are normal.  Cardiovascular: Normal rate and regular rhythm.   Pulmonary/Chest: Effort normal and breath sounds normal. No stridor. No respiratory distress.  Abdominal: She exhibits no distension.  Musculoskeletal: She exhibits no edema.  Neurological: She is alert and oriented to person, place, and time. She  displays no atrophy and no tremor. No cranial nerve deficit or sensory deficit. She exhibits normal muscle tone. She displays no seizure activity. Gait normal.  Skin: Skin is warm and dry.  Psychiatric: She has a normal mood and affect.  Nursing note and vitals reviewed.   ED Course  Procedures (including critical care time) Labs Review Labs Reviewed  URINALYSIS, ROUTINE W REFLEX MICROSCOPIC (NOT AT [facility]) - Abnormal; Notable for the following:    APPearance CLOUDY (*)    Leukocytes, UA MODERATE (*)    All other components within normal limits  URINE MICROSCOPIC-ADD ON - Abnormal; Notable for the following:    Squamous Epithelial / LPF MANY (*)    Bacteria, UA MANY (*)    All other components within normal limits  CBC  LIPASE, BLOOD  COMPREHENSIVE METABOLIC PANEL  BRAIN NATRIURETIC PEPTIDE  CBG MONITORING, ED    Imaging Review No results found. I have personally reviewed and evaluated these images and lab results as part of my medical decision-making.   EKG Interpretation   Date/Time:  Thursday September 19 2014 18:49:33 EDT Ventricular Rate:  70 PR Interval:  188 QRS Duration: 80 QT Interval:  414 QTC Calculation: 447 R Axis:     Text Interpretation:  Normal sinus rhythm Possible Anterior infarct , age  undetermined T wave abnormality, consider lateral ischemia Abnormal ECG  Sinus rhythm T wave abnormality Abnormal ekg Confirmed by Gerhard Munch   MD [phone]) on 09/19/2014 7:20:31 PM     Chart review demonstrates recent evaluation here for dental pain   MDM   Final diagnoses:  UTI (lower urinary tract infection)  Other fatigue   Elderly female presents with concern of ongoing fatigue, diarrhea. Diarrhea seems chronic. Here the patient is awake, alert, hemodynamically stable. Labs are largely reassuring, though there is some evidence for urinary tract infection. Patient received fluid resuscitation, antibiotics, was discharged in stable condition primary care  follow-up.  Gerhard Munch, MD 09/20/14 [phone]

## 2014-09-19 NOTE — Discharge Instructions (Signed)
As discussed, it is important that you follow up as soon as possible with your physician for continued management of your condition. ° °If you develop any new, or concerning changes in your condition, please return to the emergency department immediately. ° °

## 2014-10-05 ENCOUNTER — Encounter (HOSPITAL_COMMUNITY): Payer: Self-pay | Admitting: Emergency Medicine

## 2014-10-05 ENCOUNTER — Emergency Department (HOSPITAL_COMMUNITY)
Admission: EM | Admit: 2014-10-05 | Discharge: 2014-10-07 | Disposition: A | Discharge status: Other Acute Inpatient Hospital | Payer: Self-pay | Attending: Emergency Medicine | Admitting: Emergency Medicine

## 2014-10-05 DIAGNOSIS — R5383 Other fatigue: Secondary | ICD-10-CM | POA: Insufficient documentation

## 2014-10-05 DIAGNOSIS — Z87891 Personal history of nicotine dependence: Secondary | ICD-10-CM | POA: Insufficient documentation

## 2014-10-05 DIAGNOSIS — I10 Essential (primary) hypertension: Secondary | ICD-10-CM | POA: Insufficient documentation

## 2014-10-05 DIAGNOSIS — R45851 Suicidal ideations: Secondary | ICD-10-CM

## 2014-10-05 DIAGNOSIS — K219 Gastro-esophageal reflux disease without esophagitis: Secondary | ICD-10-CM | POA: Insufficient documentation

## 2014-10-05 DIAGNOSIS — Z79899 Other long term (current) drug therapy: Secondary | ICD-10-CM | POA: Insufficient documentation

## 2014-10-05 HISTORY — DX: Suicidal ideations: R45.851

## 2014-10-05 LAB — COMPREHENSIVE METABOLIC PANEL
ALBUMIN: 3.4 g/dL — AB (ref 3.5–5.0)
ALT: 19 U/L (ref 14–54)
AST: 28 U/L (ref 15–41)
Alkaline Phosphatase: 92 U/L (ref 38–126)
Anion gap: 9 (ref 5–15)
BUN: 11 mg/dL (ref 6–20)
CHLORIDE: 94 mmol/L — AB (ref 101–111)
CO2: 27 mmol/L (ref 22–32)
Calcium: 9 mg/dL (ref 8.9–10.3)
Creatinine, Ser: 0.8 mg/dL (ref 0.44–1.00)
GFR calc Af Amer: >60 mL/min (ref >60)
Glucose, Bld: 143 mg/dL — ABNORMAL HIGH (ref 65–99)
POTASSIUM: 3.7 mmol/L (ref 3.5–5.1)
SODIUM: 130 mmol/L — AB (ref 135–145)
Total Bilirubin: 0.6 mg/dL (ref 0.3–1.2)
Total Protein: 7 g/dL (ref 6.5–8.1)

## 2014-10-05 LAB — RAPID URINE DRUG SCREEN, HOSP PERFORMED
AMPHETAMINES: NOT DETECTED
BENZODIAZEPINES: NOT DETECTED
Barbiturates: NOT DETECTED
Cocaine: NOT DETECTED
OPIATES: NOT DETECTED
Tetrahydrocannabinol: NOT DETECTED

## 2014-10-05 LAB — SALICYLATE LEVEL: Salicylate Lvl: <4 mg/dL (ref 2.8–30.0)

## 2014-10-05 LAB — URINE MICROSCOPIC-ADD ON

## 2014-10-05 LAB — CBC
HCT: 42.1 % (ref 36.0–46.0)
Hemoglobin: 14.3 g/dL (ref 12.0–15.0)
MCH: 29.5 pg (ref 26.0–34.0)
MCHC: 34 g/dL (ref 30.0–36.0)
MCV: 87 fL (ref 78.0–100.0)
PLATELETS: 330 10*3/uL (ref 150–400)
RBC: 4.84 MIL/uL (ref 3.87–5.11)
RDW: 12.9 % (ref 11.5–15.5)
WBC: 9.5 10*3/uL (ref 4.0–10.5)

## 2014-10-05 LAB — URINALYSIS, ROUTINE W REFLEX MICROSCOPIC
Bilirubin Urine: NEGATIVE
GLUCOSE, UA: NEGATIVE mg/dL
HGB URINE DIPSTICK: NEGATIVE
Ketones, ur: 15 mg/dL — AB
Nitrite: NEGATIVE
Protein, ur: NEGATIVE mg/dL
SPECIFIC GRAVITY, URINE: 1.017 (ref 1.005–1.030)
Urobilinogen, UA: 1 mg/dL (ref 0.0–1.0)
pH: 7 (ref 5.0–8.0)

## 2014-10-05 LAB — ETHANOL

## 2014-10-05 LAB — ACETAMINOPHEN LEVEL: Acetaminophen (Tylenol), Serum: <10 ug/mL — ABNORMAL LOW (ref 10–30)

## 2014-10-05 MED ORDER — ACETAMINOPHEN 325 MG PO TABS: 650.0000 mg | ORAL_TABLET | ORAL | Status: DC | PRN

## 2014-10-05 MED ORDER — ALUM & MAG HYDROXIDE-SIMETH 200-200-20 MG/5ML PO SUSP: 30.0000 mL | ORAL | Status: DC | PRN

## 2014-10-05 MED ORDER — ONDANSETRON HCL 4 MG PO TABS
4.0000 mg | ORAL_TABLET | Freq: Three times a day (TID) | ORAL | Status: DC | PRN
Start: 2014-10-05 — End: 2014-10-07

## 2014-10-05 NOTE — ED Notes (Signed)
Pt here for suicidal ideations ongoing for some time per family. Pt has been refusing to eat. Pt also has been lethargic for weeks.

## 2014-10-05 NOTE — BH Assessment (Signed)
Assessment Note  Barbara Beck is an 81 y.o. female, who requested to be brought to [facility] by Son and Daughter-in-Law.  The Patient is from the [place] and her English is limited.  An Interpreter was utilized when assessing the Patient.  Via Interpreter the Patient reports "I don't want to live anymore."  Unable to obtain additional information from the Patient because of her low tone of voice and Interpreter's inability to hear her.  Unable to assess the Patient orientation and her affect was depressed.  Collateral information obtained from the Patient's Son and Daughter-in-Law who were present in the room and the Patient has been residing with them since January 2016.  They report the Patient  has never been violent or threaten to harm another.  They also reports the Patient has never appeared to be experiencing hallucinations or other psychosis.  Patient's Daughter-in-Law reports prior to the Patient residing with them she resided with a Son in [place] for a year.  Per Daughter-in-Law; Patient lived in [place] with 2 Daughters, one has cancer, prior to living in [place].  Daughter-in-law reports the Patient appetite is poor and that she has lost 50 lbs since January 2016.  She reports the Patient is not bathing daily and no completing other hygiene needs.  She reports the Patient has difficulty completing ADLs because she is physically weak.  Daughter-in-Law reports the Patient has crying episodes at home and at the [facility] and has reported on several occassions that she does not want to live or be a burden on them.  The Daughter-in-Law reports her Son committed suicide in October 2014 and that the Patient's was sadden over his death and talked about how he should have gotten help.  Daughter-in-Law reports some of the Patient's close friends have also died recently.  The Son and Daughter-in-Law both reports the Patient's limited English has made it difficult for her to acclimated  and socialized with others.  They reports being unaware of the Patient being previously hospitalized for mental illness.  The Daughter-in-Law reports the Patient went missing for days and was spending money on irrelevant things while she was living in the [place] with her Daughters.  They report the Patient does not take drugs.        Axis I: Depressive Disorder NOS Axis II: Deferred Axis IV: other psychosocial or environmental problems and problems with access to health care services Axis V: 11-20 some danger of hurting self or others possible OR occasionally fails to maintain minimal personal hygiene OR gross impairment in communication  Past Medical History:  Past Medical History  Diagnosis Date  . Hypertension ?   Marland Kitchen GERD (gastroesophageal reflux disease) ?   Marland Kitchen Suicidal ideation     Past Surgical History  Procedure Laterality Date  . Eye surgery Left     Cataract Surgery     Family History:  Family History  Problem Relation Age of Onset  . Cancer Daughter     breast with bone mets   . Hypertension Father     Social History:  reports that she quit smoking about 30 years ago. She has never used smokeless tobacco. She reports that she does not drink alcohol or use illicit drugs.  Additional Social History:     CIWA: CIWA-Ar BP: 147/70 mmHg Pulse Rate: 77 COWS:    Allergies:  Allergies  Allergen Reactions  . Aspirin Nausea Only    NOTHING THAT CONTAINS ASPIRIN    Home Medications:  (  Not in a hospital admission)  OB/GYN Status:  No LMP recorded. Patient is postmenopausal.  General Assessment Data Location of Assessment: [facility] ED TTS Assessment: In system Is this a Tele or Face-to-Face Assessment?: Tele Assessment Is this an Initial Assessment or a Re-assessment for this encounter?: Initial Assessment Marital status: Widowed [place] name: Bango Is patient pregnant?: No Pregnancy Status: No Living Arrangements: Other relatives (Lives with Son,  Daughter-in-Lawe, and adult Grand Daughter) Can pt return to current living arrangement?: Yes Admission Status: Voluntary Is patient capable of signing voluntary admission?: Yes Referral Source: Self/Family/Friend Insurance type: None  Medical Screening Exam [facility] Walk-in ONLY) Medical Exam completed: Yes  Crisis Care Plan Living Arrangements: Other relatives (Lives with Son, Daughter-in-Lawe, and adult [place] Daughter) Name of Psychiatrist: None Name of Therapist: None  Education Status Is patient currently in school?: No Current Grade: N/A Highest grade of school patient has completed: Patient never attended school Name of school: N/A Contact person: N/A  Risk to self with the past 6 months Suicidal Ideation: Yes-Currently Present Has patient been a risk to self within the past 6 months prior to admission? : Yes Suicidal Intent: No Has patient had any suicidal intent within the past 6 months prior to admission? : No Is patient at risk for suicide?: Yes Suicidal Plan?: No Has patient had any suicidal plan within the past 6 months prior to admission? : No Access to Means: No What has been your use of drugs/alcohol within the last 12 months?: None Previous Attempts/Gestures: No How many times?: 0 Other Self Harm Risks: None Triggers for Past Attempts: None known Intentional Self Injurious Behavior: None Family Suicide History: Yes (Grand Son in 2014) Recent stressful life event(s): Loss (Comment) (Close Friends have died recently, difficulty acclim,ating) Persecutory voices/beliefs?: No Depression: Yes Depression Symptoms: Tearfulness, Feeling worthless/self pity, Fatigue, Loss of interest in usual pleasures Substance abuse history and/or treatment for substance abuse?: No Suicide prevention information given to non-admitted patients: Not applicable  Risk to Others within the past 6 months Homicidal Ideation: No Does patient have any lifetime risk of violence toward others  beyond the six months prior to admission? : No Thoughts of Harm to Others: No Current Homicidal Intent: No Current Homicidal Plan: No Access to Homicidal Means: No Identified Victim: N/A History of harm to others?: No Assessment of Violence: None Noted Violent Behavior Description: N/A Does patient have access to weapons?: No (Daughter-in-Law reports no guns in the home.) Criminal Charges Pending?: No Does patient have a court date: No Is patient on probation?: No  Psychosis Hallucinations: None noted Delusions: None noted  Mental Status Report Appearance/Hygiene: In hospital gown Eye Contact: Poor Motor Activity: Unsteady Speech: Language other than English Level of Consciousness: Alert Mood: Depressed, Sad Affect: Depressed, Sad Anxiety Level: Minimal Thought Processes: Unable to Assess Judgement: Impaired Orientation: Unable to assess Obsessive Compulsive Thoughts/Behaviors: Unable to Assess  Cognitive Functioning Concentration: Unable to Assess Memory: Unable to Assess IQ: Average Insight: Unable to Assess Impulse Control: Unable to Assess Appetite: Poor Weight Loss: 50 (Since Jan 2016) Weight Gain: 0 Sleep: No Change (Per Daughter-in-Law) Total Hours of Sleep: 9 Vegetative Symptoms: Not bathing (Per Daughter-in-law report)  ADLScreening [facility] Assessment Services) Patient's cognitive ability adequate to safely complete daily activities?: No (Per Daughter-in-Law Patient has difficulty completing ADLs due to physical weakness) Patient able to express need for assistance with ADLs?: Yes Independently performs ADLs?: No  Prior Inpatient Therapy Prior Inpatient Therapy: No Prior Therapy Dates: N/A Prior Therapy Facilty/Provider(s): N/A  Reason for Treatment: N/A  Prior Outpatient Therapy Prior Outpatient Therapy: No Prior Therapy Dates: N/A Prior Therapy Facilty/Provider(s): N/A Reason for Treatment: N/A Does patient have an ACCT team?: No Does patient have  Intensive In-House Services?  : No Does patient have [facility] services? : No Does patient have P4CC services?: No  ADL Screening (condition at time of admission) Patient's cognitive ability adequate to safely complete daily activities?: No (Per Daughter-in-Law Patient has difficulty completing ADLs due to physical weakness) Patient able to express need for assistance with ADLs?: Yes Does the patient have difficulty dressing or bathing?:  (Per Daughter-in-law Patient has physical weakness) Independently performs ADLs?: No Grooming: Needs assistance Is this a change from baseline?: Pre-admission baseline Bathing: Needs assistance Is this a change from baseline?: Pre-admission baseline Toileting: Needs assistance Is this a change from baseline?: Pre-admission baseline Weakness of Legs: Both Weakness of Arms/Hands: Both  Home Assistive Devices/Equipment Home Assistive Devices/Equipment: None    Abuse/Neglect Assessment (Assessment to be complete while patient is alone) Physical Abuse: Denies Verbal Abuse: Denies Sexual Abuse: Denies Exploitation of patient/patient's resources: Denies Self-Neglect: Denies Values / Beliefs Cultural Requests During Hospitalization: None Spiritual Requests During Hospitalization: None   Advance Directives (For Healthcare) Does patient have an advance directive?: No Would patient like information on creating an advanced directive?: No - patient declined information    Additional Information 1:1 In Past 12 Months?: No CIRT Risk: No Elopement Risk: No Does patient have medical clearance?: Yes     Disposition:  Disposition Initial Assessment Completed for this Encounter: Yes Disposition of Patient: Inpatient treatment program Type of inpatient treatment program: Adult  On Site Evaluation by:   Reviewed with Physician:    Dey-Johnson,Ivy 10/05/2014 9:11 PM

## 2014-10-05 NOTE — BH Assessment (Signed)
1950:  Consulted with Charlestine Night, PA-C about the Patient.  Reports Patient is from the [place] and her English is limited.  Reports Patient has been in the [place] a few months and is having difficulty acclimating.  Reports Patient has recent loss of immediate family members.   Will need to utilized interpreter services for the tele-assessment.    2000:  Waiting for tele-assessment machine, because another [facility] Patient is currently being assessed.     2050:  Completed assessment.  2122:  Consulted with Extender Alberteen Sam, NP.  Per Extender Patient meets inpatient criteria seek outside placement.  2130:  Provided Patient's disposition to [employer], PA-C.

## 2014-10-06 ENCOUNTER — Other Ambulatory Visit: Payer: Self-pay

## 2014-10-06 ENCOUNTER — Emergency Department (HOSPITAL_COMMUNITY): Payer: Self-pay

## 2014-10-06 MED ORDER — FAMOTIDINE 20 MG PO TABS: 20.0000 mg | ORAL_TABLET | Freq: Every day | ORAL | Status: DC

## 2014-10-06 MED ORDER — LISINOPRIL-HYDROCHLOROTHIAZIDE 20-25 MG PO TABS: 1.0000 | ORAL_TABLET | Freq: Every day | ORAL | Status: DC

## 2014-10-06 MED ORDER — AMLODIPINE BESYLATE 5 MG PO TABS
10.0000 mg | ORAL_TABLET | Freq: Every day | ORAL | Status: DC
  Administered 2014-10-06 – 2014-10-07 (×2): 10 mg via ORAL
  Filled 2014-10-06 (×2): qty 2

## 2014-10-06 MED ORDER — HYDROCHLOROTHIAZIDE 25 MG PO TABS
25.0000 mg | ORAL_TABLET | Freq: Every day | ORAL | Status: DC
Start: 2014-10-06 — End: 2014-10-07
  Administered 2014-10-06 – 2014-10-07 (×2): 25 mg via ORAL
  Filled 2014-10-06 (×2): qty 1

## 2014-10-06 MED ORDER — LISINOPRIL 20 MG PO TABS
20.0000 mg | ORAL_TABLET | Freq: Every day | ORAL | Status: DC
  Administered 2014-10-06 – 2014-10-07 (×2): 20 mg via ORAL
  Filled 2014-10-06 (×2): qty 1

## 2014-10-06 NOTE — ED Notes (Signed)
Barbara Beck, SW, [facility], discussed w/Joyce, SW, of tx plan if pt accepted to [facility] and talking w/family.

## 2014-10-06 NOTE — ED Notes (Signed)
CXR and EKG faxed to [facility].

## 2014-10-06 NOTE — ED Notes (Signed)
Darrel Reach, pt's daughter-in-law -- [phone] - called to check on pt. States she will be here in am and will cal back to check on pt. OK to give her info per pt.

## 2014-10-06 NOTE — ED Notes (Addendum)
PER PT'S DAUGHTER-IN-LAW - PT EATS VEGETABLES, COFFEE W/CREAM AND SUGAR, FISH, OATMEAL, TOAST AND PANCAKES. PT WILL  NOT EAT/DRINK EGGS, ANY MEAT OR ORANGE JUICE D/T REFLUX.

## 2014-10-06 NOTE — ED Notes (Signed)
Pt's daughter-in-law's work number - [phone] - d/t cell phone does not work in her office.

## 2014-10-06 NOTE — Progress Notes (Signed)
Disposition CSW completed patient referrals to the following Geri-Psych inpatient facilities:  [facility]  CSW will continue to assist as needed with placement.  Seward Speck [facility] Disposition CSW [phone]

## 2014-10-06 NOTE — Progress Notes (Signed)
CSW attempted to call individual listed as emergency contact: Vallery Ridge at [phone] but received no answer. Left call back number at 2:47pm. CSW will continue to follow up.   Fernande Boyden, LCSWA Clinical Social Worker [facility] Ph: [phone]

## 2014-10-06 NOTE — ED Notes (Addendum)
[facility] called and requested for pt to have CXR and EKG for placement and fax to [phone]. Advised them pt will need consent form written in Spanish.

## 2014-10-07 ENCOUNTER — Inpatient Hospital Stay (HOSPITAL_COMMUNITY)
Admission: AD | Admit: 2014-10-07 | Discharge: 2014-10-09 | DRG: 885 | Disposition: A | Discharge status: Other Acute Inpatient Hospital | Payer: Federal, State, Local not specified - Other | Source: Intra-hospital | Attending: Internal Medicine | Admitting: Internal Medicine

## 2014-10-07 ENCOUNTER — Encounter (HOSPITAL_COMMUNITY): Payer: Self-pay | Admitting: *Deleted

## 2014-10-07 DIAGNOSIS — I10 Essential (primary) hypertension: Secondary | ICD-10-CM | POA: Diagnosis present

## 2014-10-07 DIAGNOSIS — R45851 Suicidal ideations: Secondary | ICD-10-CM | POA: Diagnosis present

## 2014-10-07 DIAGNOSIS — N390 Urinary tract infection, site not specified: Secondary | ICD-10-CM | POA: Diagnosis present

## 2014-10-07 DIAGNOSIS — K219 Gastro-esophageal reflux disease without esophagitis: Secondary | ICD-10-CM | POA: Diagnosis present

## 2014-10-07 DIAGNOSIS — Z5309 Procedure and treatment not carried out because of other contraindication: Secondary | ICD-10-CM

## 2014-10-07 DIAGNOSIS — F332 Major depressive disorder, recurrent severe without psychotic features: Secondary | ICD-10-CM | POA: Diagnosis present

## 2014-10-07 DIAGNOSIS — M1612 Unilateral primary osteoarthritis, left hip: Secondary | ICD-10-CM | POA: Diagnosis present

## 2014-10-07 DIAGNOSIS — E860 Dehydration: Secondary | ICD-10-CM | POA: Diagnosis present

## 2014-10-07 DIAGNOSIS — Z87891 Personal history of nicotine dependence: Secondary | ICD-10-CM

## 2014-10-07 DIAGNOSIS — R55 Syncope and collapse: Secondary | ICD-10-CM

## 2014-10-07 DIAGNOSIS — G4700 Insomnia, unspecified: Secondary | ICD-10-CM | POA: Diagnosis present

## 2014-10-07 DIAGNOSIS — F32A Depression, unspecified: Secondary | ICD-10-CM | POA: Diagnosis present

## 2014-10-07 DIAGNOSIS — F329 Major depressive disorder, single episode, unspecified: Secondary | ICD-10-CM | POA: Diagnosis present

## 2014-10-07 MED ORDER — MAGNESIUM HYDROXIDE 400 MG/5ML PO SUSP: 30.0000 mL | Freq: Every day | ORAL | Status: DC | PRN

## 2014-10-07 MED ORDER — CALCIUM CARBONATE-VITAMIN D 500-200 MG-UNIT PO TABS
1.0000 | ORAL_TABLET | Freq: Every day | ORAL | Status: DC
  Administered 2014-10-07: 1 via ORAL
  Filled 2014-10-07 (×6): qty 1

## 2014-10-07 MED ORDER — ACETAMINOPHEN 325 MG PO TABS: 650.0000 mg | ORAL_TABLET | Freq: Four times a day (QID) | ORAL | Status: DC | PRN

## 2014-10-07 MED ORDER — ALUM & MAG HYDROXIDE-SIMETH 200-200-20 MG/5ML PO SUSP: 30.0000 mL | ORAL | Status: DC | PRN

## 2014-10-07 MED ORDER — LISINOPRIL 20 MG PO TABS
20.0000 mg | ORAL_TABLET | Freq: Every day | ORAL | Status: DC
  Filled 2014-10-07 (×4): qty 1

## 2014-10-07 MED ORDER — ENSURE ENLIVE PO LIQD
237.0000 mL | Freq: Two times a day (BID) | ORAL | Status: DC
  Administered 2014-10-07 – 2014-10-08 (×2): 237 mL via ORAL
  Filled 2014-10-07: qty 237

## 2014-10-07 MED ORDER — LISINOPRIL-HYDROCHLOROTHIAZIDE 20-25 MG PO TABS: 1.0000 | ORAL_TABLET | Freq: Every day | ORAL | Status: DC

## 2014-10-07 MED ORDER — VITAMIN B-12 1000 MCG PO TABS
500.0000 ug | ORAL_TABLET | Freq: Every day | ORAL | Status: DC
Start: 2014-10-07 — End: 2014-10-09
  Administered 2014-10-07: 500 ug via ORAL
  Filled 2014-10-07 (×2): qty 0.5
  Filled 2014-10-07: qty 1
  Filled 2014-10-07 (×2): qty 0.5
  Filled 2014-10-07: qty 1

## 2014-10-07 MED ORDER — CALCIUM-VITAMIN D 600-400 MG-UNIT PO TABS: 1.0000 | ORAL_TABLET | Freq: Every day | ORAL | Status: DC

## 2014-10-07 MED ORDER — HYDROXYZINE HCL 25 MG PO TABS: 25.0000 mg | ORAL_TABLET | Freq: Four times a day (QID) | ORAL | Status: DC | PRN

## 2014-10-07 MED ORDER — TRAZODONE HCL 50 MG PO TABS: 50.0000 mg | ORAL_TABLET | Freq: Every evening | ORAL | Status: DC | PRN

## 2014-10-07 MED ORDER — HYDROCHLOROTHIAZIDE 25 MG PO TABS
25.0000 mg | ORAL_TABLET | Freq: Every day | ORAL | Status: DC
  Filled 2014-10-07 (×4): qty 1

## 2014-10-07 MED ORDER — FAMOTIDINE 20 MG PO TABS
10.0000 mg | ORAL_TABLET | Freq: Two times a day (BID) | ORAL | Status: DC
  Administered 2014-10-07 – 2014-10-08 (×2): 10 mg via ORAL
  Filled 2014-10-07 (×7): qty 1

## 2014-10-07 MED ORDER — AMLODIPINE BESYLATE 10 MG PO TABS: 10.0000 mg | ORAL_TABLET | Freq: Every day | ORAL | Status: DC

## 2014-10-07 NOTE — Tx Team (Signed)
Initial Interdisciplinary Treatment Plan   PATIENT STRESSORS: Health problems Loss of home/dominican   PATIENT STRENGTHS: Capable of independent living Communication skills General fund of knowledge Motivation for treatment/growth   PROBLEM LIST: Problem List/Patient Goals Date to be addressed Date deferred Reason deferred Estimated date of resolution  depression 10-07-14   dc  Suicide risk 10-07-14   dc                                             DISCHARGE CRITERIA:  Ability to meet basic life and health needs Improved stabilization in mood, thinking, and/or behavior Medical problems require only outpatient monitoring Motivation to continue treatment in a less acute level of care Reduction of life-threatening or endangering symptoms to within safe limits Safe-care adequate arrangements made  PRELIMINARY DISCHARGE PLAN: Attend aftercare/continuing care group Outpatient therapy Participate in family therapy Return to previous living arrangement  PATIENT/FAMIILY INVOLVEMENT: This treatment plan has been presented to and reviewed with the patient, Barbara Beck, and/or family member, .  The patient and family have been given the opportunity to ask questions and make suggestions.  [name] 10/07/2014, 4:41 PM

## 2014-10-07 NOTE — ED Notes (Signed)
Patient appears to be asleep; sitter at bedside

## 2014-10-07 NOTE — Progress Notes (Signed)
Barbara Beck at [facility] states pt's EKG and chest x-ray results were received overnight. States they will call if they are going to have open beds today and can accept pt for admission.  Ilean Skill, MSW, LCSW Clinical Social Work, Disposition  10/07/2014 [phone]

## 2014-10-07 NOTE — Progress Notes (Signed)
Adult Psychoeducational Group Note  Date:  10/07/2014 Time:  9:51 PM  Group Topic/Focus:  Wrap-Up Group:   The focus of this group is to help patients review their daily goal of treatment and discuss progress on daily workbooks.  Participation Level:  Minimal  Participation Quality:  Appropriate  Affect:  Appropriate  Cognitive:  Alert  Insight: Appropriate  Engagement in Group:  Engaged  Modes of Intervention:  Discussion  Additional Comments:  On a scale from 1-10 (1=worse, 10=best), patient rated her day as an 8. Patient had an interpreter present with her. Patient stated she has not felt well today and that she feels nervous. Patient stated a positive thing that happened today was her being able to attend group.   Barbara Beck 10/07/2014, 9:51 PM

## 2014-10-07 NOTE — Progress Notes (Signed)
Pt c/o one loose stool. Writer asked pt, per interpreter, to save each BM that may appears loose or watery in order for nurse to be able document and tx. Pt reported that her supper caused her to have diarrhea. Writer will continue to monitor pt stool and report off in report.

## 2014-10-07 NOTE — Progress Notes (Signed)
Pt admitted to 400 hall Adult Unit for voluntary admission. Patient spanish speaking only, admission completed with this Clinical research associate speaking spanish . Patient able to provide limited history. Patient states she has been here 9 months from [place] and has been increasingly depressed with thoughts to hurt herself. She states she no longer wants to live, she has no energy to bathe, or eat or dress herself. She is tearful with Clinical research associate. She reports she has health problems of ''circulation '' Patient denies history of falls but with Moderate fall risk. Patient oriented to the unit. Report given to [facility] primary RN. Patient provided spanish speaking handbook ,and charge RN notified SW to obtain interpreter for this evening.

## 2014-10-07 NOTE — Progress Notes (Signed)
Followed up on inpatient placement efforts  Referred to: [facility]- per Delorise Shiner, "patient looks appropriate, I will call when if we have a bed for her" [facility]- per [facility]. [facility]- per [facility]- per Bonita Quin (states she believes "last open gero bed was just filled but fax just in case")  Other gero psych facilities contacted are at capacity.  (Declined: [facility]- gero psych unit requires dx of dementia)  Ilean Skill, MSW, LCSW Clinical Social Work, Disposition  10/07/2014 [phone]

## 2014-10-07 NOTE — ED Notes (Signed)
[place] driver here to transport patient

## 2014-10-07 NOTE — Progress Notes (Signed)
Per Tanna Savoy, Dr. Dub Mikes has accepted pt to [facility] bed 400-1. Admission is voluntary. AC spoke with [facility] re: pt's placement.  Ilean Skill, MSW, LCSW Clinical Social Work, Disposition  10/07/2014 [phone]

## 2014-10-07 NOTE — ED Notes (Signed)
Pt requesting shower; informed pt of delay; pt with steady gait to shower with sitter

## 2014-10-07 NOTE — ED Notes (Signed)
Informed Barbara Beck that pt still needs placement

## 2014-10-07 NOTE — ED Provider Notes (Signed)
CSN: [id number]     Arrival date & time 10/05/14  1614 History   First MD Initiated Contact with Patient 10/05/14 1913     Chief Complaint  Patient presents with  . Suicidal     (Consider location/radiation/quality/duration/timing/severity/associated sxs/prior Treatment) HPI Patient presents to the emergency department with suicidal thoughts.  The patient is brought in by her daughter-in-law and son and daughter-in-law is translating for the patient.  The patient was brought here from the [place] several months ago and she does not want to be here as her friends and him of her family are in the [place].  She states that several have her friends died and she has not been needed to go to their funerals these things are upsetting to her.  She does not acclimate well here in the [place].  She states that she is does not want to live.  Patient denies hallucinations, nausea, vomiting, abdominal pain, chest pain, shortness breath or weakness, dizziness, headache, blurred vision, dysuria, incontinence, or syncope Past Medical History  Diagnosis Date  . Hypertension ?   Marland Kitchen GERD (gastroesophageal reflux disease) ?   Marland Kitchen Suicidal ideation    Past Surgical History  Procedure Laterality Date  . Eye surgery Left     Cataract Surgery    Family History  Problem Relation Age of Onset  . Cancer Daughter     breast with bone mets   . Hypertension Father    Social History  Substance Use Topics  . Smoking status: Former Smoker -- 2.00 packs/day for 13 years    Quit date: 01/19/1984  . Smokeless tobacco: Never Used  . Alcohol Use: No   OB History    No data available     Review of Systems  All other systems negative except as documented in the HPI. All pertinent positives and negatives as reviewed in the HPI.  Allergies  Aspirin  Home Medications   Prior to Admission medications   Medication Sig Start Date End Date Taking? Authorizing Provider  acetaminophen  (TYLENOL) 325 MG tablet Take 2 tablets (650 mg total) by mouth 3 (three) times daily as needed for moderate pain. 03/11/14  Yes Josalyn Funches, MD  amLODipine (NORVASC) 10 MG tablet Take 1 tablet (10 mg total) by mouth daily. 08/08/14  Yes Josalyn Funches, MD  Calcium Carb-Cholecalciferol (CALCIUM-VITAMIN D) 600-400 MG-UNIT TABS Take 1 tablet by mouth daily.    Yes Historical Provider, MD  cyanocobalamin 500 MCG tablet Take 1 tablet (500 mcg total) by mouth daily. 02/28/14  Yes Josalyn Funches, MD  lisinopril-hydrochlorothiazide (PRINZIDE,ZESTORETIC) 20-25 MG per tablet Take 1 tablet by mouth daily. 03/19/14  Yes Josalyn Funches, MD  pyridOXINE (B-6) 50 MG tablet Take 1 tablet (50 mg total) by mouth daily. 02/28/14  Yes Josalyn Funches, MD  ranitidine (ZANTAC) 150 MG tablet TAKE 1 TABLET BY MOUTH 2 TIMES DAILY Patient taking differently: TAKE 1 TABLET BY MOUTH AT BEDTIME 08/02/14  Yes Josalyn Funches, MD  Thiamine 50 MG CAPS Take 1 capsule (50 mg total) by mouth daily. 02/28/14  Yes Josalyn Funches, MD  acetaminophen-codeine (TYLENOL #3) 300-30 MG per tablet Take 1 tablet by mouth every 8 (eight) hours as needed for moderate pain or severe pain. 08/08/14   Josalyn Funches, MD  fluticasone (FLONASE) 50 MCG/ACT nasal spray Place 2 sprays into both nostrils daily. Patient not taking: Reported on 09/19/2014 01/29/14   Josalyn Funches, MD   BP 124/68 mmHg  Pulse 70  Temp(Src) 98.8 F (  37.1 C) (Axillary)  Resp 16  Ht  (1.499 m)  Wt 120 lb (54.432 kg)  BMI 24.22 kg/m2  SpO2 96% Physical Exam  Constitutional: She is oriented to person, place, and time. She appears well-developed and well-nourished. No distress.  HENT:  Head: Normocephalic and atraumatic.  Mouth/Throat: Oropharynx is clear and moist.  Eyes: Pupils are equal, round, and reactive to light.  Neck: Normal range of motion. Neck supple.  Cardiovascular: Normal rate, regular rhythm and normal heart sounds.  Exam reveals no gallop and no  friction rub.   No murmur heard. Pulmonary/Chest: Effort normal and breath sounds normal. No respiratory distress.  Neurological: She is alert and oriented to person, place, and time. She exhibits normal muscle tone. Coordination normal.  Skin: Skin is warm and dry. No rash noted. No erythema.  Psychiatric: She has a normal mood and affect. Her speech is normal and behavior is normal. She expresses suicidal ideation. She expresses no homicidal ideation. She expresses no suicidal plans and no homicidal plans.  Nursing note and vitals reviewed.   ED Course  Procedures (including critical care time) Labs Review Labs Reviewed  COMPREHENSIVE METABOLIC PANEL - Abnormal; Notable for the following:    Sodium 130 (*)    Chloride 94 (*)    Glucose, Bld 143 (*)    Albumin 3.4 (*)    All other components within normal limits  ACETAMINOPHEN LEVEL - Abnormal; Notable for the following:    Acetaminophen (Tylenol), Serum <10 (*)    All other components within normal limits  URINALYSIS, ROUTINE W REFLEX MICROSCOPIC (NOT AT [facility]) - Abnormal; Notable for the following:    APPearance CLOUDY (*)    Ketones, ur 15 (*)    Leukocytes, UA LARGE (*)    All other components within normal limits  URINE MICROSCOPIC-ADD ON - Abnormal; Notable for the following:    Squamous Epithelial / LPF MANY (*)    Casts HYALINE CASTS (*)    All other components within normal limits  ETHANOL  SALICYLATE LEVEL  CBC  URINE RAPID DRUG SCREEN, HOSP PERFORMED    Imaging Review Dg Chest 2 View  10/06/2014   CLINICAL DATA:  History of hypertension, psychiatric placement  EXAM: CHEST  2 VIEW  COMPARISON:  09/19/2014  FINDINGS: Mild cardiac enlargement stable. Stable aortic arch calcification. Vascular pattern normal. Mild chronic bronchitic change. No consolidation or effusion.  IMPRESSION: No active cardiopulmonary disease.   Electronically Signed   By: Esperanza Heir M.D.   On: 10/06/2014 13:54   I have personally reviewed  and evaluated these images and lab results as part of my medical decision-making.  The patient will need TTS assessment and evaluation for her suicidal thoughts  Charlestine Night, PA-C 10/07/14 0034  Eber Hong, MD 10/08/14 1002

## 2014-10-07 NOTE — ED Notes (Signed)
Interpreter phone used to do patients pysch assessment.

## 2014-10-07 NOTE — Clinical Social Work Note (Signed)
Spanish interpreter requested 9 - 11:30 AM, 1 - 3:30 PM, 6 - 8:30 PM daily, beginning today.  [place], LCSW Clinical Social Worker

## 2014-10-07 NOTE — ED Notes (Signed)
[facility] called for pt transport.

## 2014-10-08 ENCOUNTER — Telehealth: Payer: Self-pay | Admitting: Family Medicine

## 2014-10-08 ENCOUNTER — Encounter (HOSPITAL_COMMUNITY): Payer: Self-pay | Admitting: Psychiatry

## 2014-10-08 DIAGNOSIS — F332 Major depressive disorder, recurrent severe without psychotic features: Principal | ICD-10-CM | POA: Diagnosis present

## 2014-10-08 MED ORDER — SERTRALINE HCL 25 MG PO TABS
25.0000 mg | ORAL_TABLET | Freq: Every day | ORAL | Status: DC
  Administered 2014-10-08: 25 mg via ORAL
  Filled 2014-10-08 (×5): qty 1

## 2014-10-08 NOTE — Progress Notes (Signed)
DAR Note: Patient affect is sad and mood is depressed.  States she wants to die per interpreter.  Patient appetite poor due to difficulty chewing with or without dentures as reported by patient.  Refused all morning medications.  Maintained on routine safety checks per protocol.  Fluid intake encouraged.  Patient up and in the wheel chair in her room.  Support and encouragement offered as needed.

## 2014-10-08 NOTE — Telephone Encounter (Deleted)
Patients daughter called needing clarification as to why her mother was admitted to the Behavioral health. Please follow up.

## 2014-10-08 NOTE — Progress Notes (Signed)
NUTRITION ASSESSMENT  Pt identified as at risk on the Malnutrition Screen Tool  INTERVENTION: 1. Educated patient on the importance of nutrition and encouraged intake of food and beverages. 2. Discussed weight goals. 3. Supplements: continue Ensure Enlive BID  NUTRITION DIAGNOSIS: Unintentional weight loss related to sub-optimal intake as evidenced by pt report.   Goal: Pt to meet >/= 90% of their estimated nutrition needs.  Monitor:  PO intake  Assessment:  Pt seen for MST. Unable to obtain history as pt is Spanish-speaking only. Per notes, she has been having diarrhea after intakes, to include after dinner last night. She has reported decreased energy levels with inability to eat well. Ensure Enlive has been ordered BID.  Per weight hx provided in chart, pt has lost 9 lbs (7% body weight) in the past 6 months which is not significant for time frame.  81 y.o. female  Height: Ht Readings from Last 1 Encounters:  10/07/14 5' (1.524 m)    Weight: Wt Readings from Last 1 Encounters:  10/07/14 119 lb 0.8 oz (54 kg)    Weight Hx: Wt Readings from Last 10 Encounters:  10/07/14 119 lb 0.8 oz (54 kg)  10/05/14 120 lb (54.432 kg)  09/19/14 124 lb (56.246 kg)  08/01/14 124 lb (56.246 kg)  07/10/14 126 lb 1.9 oz (57.208 kg)  04/12/14 128 lb 12.8 oz (58.423 kg)  03/19/14 132 lb (59.875 kg)  02/19/14 136 lb (61.689 kg)  01/29/14 144 lb (65.318 kg)    BMI:  Body mass index is 23.25 kg/(m^2). Pt meets criteria for normal weight based on current BMI.  Estimated Nutritional Needs: Kcal: 25-30 kcal/kg Protein: > 1 gram protein/kg Fluid: 1 ml/kcal  Diet Order: Diet Heart Room service appropriate?: Yes; Fluid consistency:: Thin Pt is also offered choice of unit snacks mid-morning and mid-afternoon.  Pt is eating as desired.   Lab results and medications reviewed.      Trenton Gammon, RD, LDN Inpatient Clinical Dietitian Pager # [phone] After hours/weekend pager #  [phone]

## 2014-10-08 NOTE — Progress Notes (Signed)
D: Patient in her room with family on approach.  Patient spoke to Clinical research associate with an interpreter once family left.  Patient states her day was so, so.  Patient states she ate some food today.  Patient states she sees people who have passed away but did not elaborate.  Patient denies SI/HI.  Patient did ask for coffee tonight but did not take any food from Clinical research associate. A: Staff to monitor Q 15 mins for safety.  Encouragement and support offered. No Scheduled medications administered per orders. R: Patient remains safe on the unit.  Patient attended group tonight. Patient visible on the unit.  Patient had no medications to be administered tonight.

## 2014-10-08 NOTE — H&P (Signed)
Psychiatric Admission Assessment Adult  Patient Identification: Barbara Beck MRN:  [id number] Date of Evaluation:  10/08/2014 Chief Complaint:  MDD Principal Diagnosis: <principal problem not specified> Diagnosis:   Patient Active Problem List   Diagnosis Date Noted  . Depression [F32.9] 10/07/2014  . Poor dentition [K08.8] 08/01/2014  . Decreased visual acuity [H54.7] 08/01/2014  . Sinus bradycardia [R00.1] 04/12/2014  . Chronic diarrhea [K52.9] 03/19/2014  . Bradycardia [R00.1] 02/19/2014  . Cataract [H26.9] 02/19/2014  . Bartholin gland cyst [N75.0] 02/19/2014  . Right sided abdominal pain [R10.9] 01/29/2014  . Allergic rhinoconjunctivitis of both eyes [J30.9, H10.13] 01/29/2014  . Vitamin D deficiency [E55.9] 01/29/2014  . SOB (shortness of breath) [R06.02] 01/29/2014  . Tingling [R20.2] 01/29/2014  . Spanish speaking patient [R47.89] 01/29/2014  . Osteoarthritis of left hip [M16.12] 01/29/2014  . DJD (degenerative joint disease), lumbar [M47.816] 01/29/2014  . Umbilical hernia [K42.9] 01/29/2014  . Hypertension [I10]   . GERD (gastroesophageal reflux disease) [K21.9]    History of Present Illness:: 81 Y/O Hispanic female who was brought by her family as she had stated she wanted to kill herself. She has been with his family in [place] for the last 9 months. She states it has been hard due to the language barrier. States that her daughter in law has done everything she can to help her adapt to being here. Both her son and her daughter in law work and they do not want to leave her alone at the house. She had been going to a senior center. States that there is only one spanish speaking client that does not go that often and even this person speaks and understands English so her communicates in [place]. She has been more depressed, has lost interest in crochet in reading her Bible. States her self esteem is very low. She is tired she wants to give up. She states she had an episode  of depression years ago but she endured it. Did not pursue treatment. Used prayer to get over it. 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 She states that when she was in [place] with her other son she did better as she found more people who spoke Spanish The initial assessment was as follows:  They report the Patient has never been violent or threaten to harm another. They also reports the Patient has never appeared to be experiencing hallucinations or other psychosis. Patient's Daughter-in-Law reports prior to the Patient residing with them she resided with a Son in [place] for a year. Per Daughter-in-Law; Patient lived in [place] with 2 Daughters, one has cancer, prior to living in [place]. Daughter-in-law reports the Patient appetite is poor and that she has lost 50 lbs since January 2016. She reports the Patient is not bathing daily and no completing other hygiene needs. She reports the Patient has difficulty completing ADLs because she is physically weak. Daughter-in-Law reports the Patient has crying episodes at home and at the [facility] and has reported on several occassions that she does not want to live or be a burden on them. The Daughter-in-Law reports her Son committed suicide in October 2014 and that the Patient's was sadden over his death and talked about how he should have gotten help. Daughter-in-Law reports some of the Patient's close friends have also died recently. The Son and Daughter-in-Law both reports the Patient's limited English has made it difficult for her to acclimated and socialized with others. They reports being unaware of  the Patient being previously hospitalized for mental illness. The Daughter-in-Law reports the Patient went missing for days and was spending money on irrelevant things while she was living in the [place] with her Daughters. They report the Patient does not take drugs.   Axis I: Depressive  Disorder NOS Axis II: Deferred Axis IV:  Elements:  Location:  Depression. Quality:  building up to wanting to die. Severity:  severe. Timing:  every day. Duration:  worst in the last couple of weeks. Context:  in [place] for the last 9 months unable to adjust having a hard time wiht the language barrier the depression getting worst, wants to kill herself. Associated Signs/Symptoms: Depression Symptoms:  anhedonia, insomnia, psychomotor retardation, fatigue, feelings of worthlessness/guilt, difficulty concentrating, hopelessness, suicidal thoughts without plan, loss of energy/fatigue, disturbed sleep, weight loss, increased appetite, (Hypo) Manic Symptoms:  denies Anxiety Symptoms:  denies Psychotic Symptoms:  denies PTSD Symptoms: Negative Total Time spent with patient: 45 minutes  Past Medical History:  Past Medical History  Diagnosis Date  . Hypertension ?   Marland Kitchen GERD (gastroesophageal reflux disease) ?   Marland Kitchen Suicidal ideation     Past Surgical History  Procedure Laterality Date  . Eye surgery Left     Cataract Surgery    Family History:  Family History  Problem Relation Age of Onset  . Cancer Daughter     breast with bone mets   . Hypertension Father   a son committed suicide Social History:  History  Alcohol Use No     History  Drug Use No    Social History   Social History  . Marital Status: Widowed    Spouse Name: N/A  . Number of Children: 5  . Years of Education: 0   Occupational History  . Retired     Social History Main Topics  . Smoking status: Former Smoker -- 2.00 packs/day for 13 years    Quit date: 01/19/1984  . Smokeless tobacco: Never Used  . Alcohol Use: No  . Drug Use: No  . Sexual Activity: No   Other Topics Concern  . None   Social History Narrative   From [place].   Moved to [place] in 2014, back to [place], back to [place] 01/18/2014.    Did not attend school.    Has 4 living children. Had 5 total, one died at 69 months  old.    Lives with son and daughter-in-law in [place].   Has a daughter, Margaretmary Lombard, who is in the [place].   80 Y/o female from the [place] living with her son and daughter in Social worker in [place]. She is a widow and had 5 children. She has been out of the [place] fo almost 2 years one in [place] with a son there and now in [place] Additional Social History:    History of alcohol / drug use?: No history of alcohol / drug abuse                     Musculoskeletal: Strength & Muscle Tone: within normal limits Gait & Station: normal Patient leans: normal  Psychiatric Specialty Exam: Physical Exam  Review of Systems  Constitutional: Positive for malaise/fatigue.  HENT: Negative.   Eyes: Negative.   Respiratory: Negative.   Cardiovascular: Negative.   Gastrointestinal: Positive for diarrhea.  Genitourinary: Negative.   Musculoskeletal: Negative.   Skin: Negative.   Neurological: Positive for weakness.  Endo/Heme/Allergies: Negative.   Psychiatric/Behavioral: Positive for depression and  suicidal ideas. The patient is nervous/anxious.     Blood pressure 128/60, pulse 71, temperature 98 F (36.7 C), temperature source Oral, resp. rate 18, height 5' (1.524 m), weight 54 kg (119 lb 0.8 oz), SpO2 100 %.Body mass index is 23.25 kg/(m^2).  General Appearance: Fairly Groomed  Patent attorney::  Fair  Speech:  Clear and Coherent and Slow  Volume:  Decreased  Mood:  Depressed  Affect:  Depressed and Restricted  Thought Process:  Coherent and Goal Directed  Orientation:  Full (Time, Place, and Person)  Thought Content:  symptoms events worries concerns  Suicidal Thoughts:  Yes.  without intent/plan  Homicidal Thoughts:  No  Memory:  Immediate;   Fair Recent;   Fair Remote;   Fair  Judgement:  Fair  Insight:  Present  Psychomotor Activity:  Decreased  Concentration:  Fair  Recall:  [employer] of Knowledge:Fair  Language: Fair  Akathisia:  No  Handed:  Right  AIMS  (if indicated):     Assets:  Housing Social Support  ADL's:  Intact  Cognition: WNL  Sleep:      Risk to Self: Is patient at risk for suicide?: Yes Risk to Others:   Prior Inpatient Therapy:  Denies Prior Outpatient Therapy:  Denies  Alcohol Screening: Patient refused Alcohol Screening Tool: Yes 1. How often do you have a drink containing alcohol?: Never  Allergies:   Allergies  Allergen Reactions  . Aspirin Hives and Nausea Only    NOTHING THAT CONTAINS ASPIRIN   Lab Results: No results found for this or any previous visit (from the past 48 hour(s)). Current Medications: Current Facility-Administered Medications  Medication Dose Route Frequency Provider Last Rate Last Dose  . acetaminophen (TYLENOL) tablet 650 mg  650 mg Oral Q6H PRN Thermon Leyland, NP      . alum & mag hydroxide-simeth (MAALOX/MYLANTA) 200-200-20 MG/5ML suspension 30 mL  30 mL Oral Q4H PRN Thermon Leyland, NP      . calcium-vitamin D (OSCAL WITH D) 500-200 MG-UNIT per tablet 1 tablet  1 tablet Oral Q breakfast Thermon Leyland, NP   1 tablet at 10/07/14 1715  . famotidine (PEPCID) tablet 10 mg  10 mg Oral BID Thermon Leyland, NP   10 mg at 10/08/14 1659  . feeding supplement (ENSURE ENLIVE) (ENSURE ENLIVE) liquid 237 mL  237 mL Oral BID BM Rachael Fee, MD   237 mL at 10/08/14 1435  . lisinopril (PRINIVIL,ZESTRIL) tablet 20 mg  20 mg Oral Daily Thermon Leyland, NP   20 mg at 10/08/14 6045   And  . hydrochlorothiazide (HYDRODIURIL) tablet 25 mg  25 mg Oral Daily Thermon Leyland, NP   25 mg at 10/08/14 0904  . hydrOXYzine (ATARAX/VISTARIL) tablet 25 mg  25 mg Oral Q6H PRN Thermon Leyland, NP      . magnesium hydroxide (MILK OF MAGNESIA) suspension 30 mL  30 mL Oral Daily PRN Thermon Leyland, NP      . sertraline (ZOLOFT) tablet 25 mg  25 mg Oral Daily Rachael Fee, MD   25 mg at 10/08/14 1659  . traZODone (DESYREL) tablet 50 mg  50 mg Oral QHS PRN Thermon Leyland, NP      . vitamin B-12 (CYANOCOBALAMIN) tablet 500 mcg  500 mcg  Oral Daily Thermon Leyland, NP   500 mcg at 10/07/14 1716   PTA Medications: Prescriptions prior to admission  Medication Sig Dispense Refill Last Dose  .  acetaminophen (TYLENOL) 325 MG tablet Take 2 tablets (650 mg total) by mouth 3 (three) times daily as needed for moderate pain. 50 tablet 1 Past Month at Unknown time  . acetaminophen-codeine (TYLENOL #3) 300-30 MG per tablet Take 1 tablet by mouth every 8 (eight) hours as needed for moderate pain or severe pain. 30 tablet 0 Past Month at Unknown time  . amLODipine (NORVASC) 10 MG tablet Take 1 tablet (10 mg total) by mouth daily. 90 tablet 3 Unknown at Unknown time  . Calcium Carb-Cholecalciferol (CALCIUM-VITAMIN D) 600-400 MG-UNIT TABS Take 1 tablet by mouth daily.    Unknown at Unknown time  . cyanocobalamin 500 MCG tablet Take 1 tablet (500 mcg total) by mouth daily. 90 tablet 3 10/05/2014 at Unknown time  . fluticasone (FLONASE) 50 MCG/ACT nasal spray Place 2 sprays into both nostrils daily. (Patient not taking: Reported on 09/19/2014) 16 g 6 Not Taking at unknown  . lisinopril-hydrochlorothiazide (PRINZIDE,ZESTORETIC) 20-25 MG per tablet Take 1 tablet by mouth daily. 90 tablet 3 Unknown at Unknown time  . pyridOXINE (B-6) 50 MG tablet Take 1 tablet (50 mg total) by mouth daily. 90 tablet 1 Unknown at Unknown time  . ranitidine (ZANTAC) 150 MG tablet TAKE 1 TABLET BY MOUTH 2 TIMES DAILY (Patient taking differently: TAKE 1 TABLET BY MOUTH AT BEDTIME) 60 tablet 2 Unknown at Unknown time  . Thiamine 50 MG CAPS Take 1 capsule (50 mg total) by mouth daily. 90 capsule 3 Unknown at Unknown time    Previous Psychotropic Medications: No   Substance Abuse History in the last 12 months:  No.    Consequences of Substance Abuse: Negative  No results found for this or any previous visit (from the past 72 hour(s)).  Observation Level/Precautions:  15 minute checks  Laboratory:  As per the ED and TSH  Psychotherapy:  Individual/group  Medications:   Will start a trial with Zoloft 25 mg daily  Consultations:    Discharge Concerns:    Estimated LOS: 3-5 days  Other:     Psychological Evaluations: No   Treatment Plan Summary: Daily contact with patient to assess and evaluate symptoms and progress in treatment and Medication management Supportive approach/coping skills Depression; will start Zoloft 25 mg daily and reassess tolerability Will facilitate offering foods that she can handle given her dentures problems Will monitor her BP Use CBT/mindfulness/prayer Medical Decision Making:  Review of Psycho-Social Stressors (1), Review or order clinical lab tests (1) and Review of Medication Regimen & Side Effects (2)  I certify that inpatient services furnished can reasonably be expected to improve the patient's condition.   LUGO,IRVING A 9/20/20166:15 PM

## 2014-10-08 NOTE — Progress Notes (Signed)
D: Patient assessment preformed with interpreter. Patient alert and oriented x 4. Patient denies pain/SI/HI/AVH. Patient states she is depressed because of the language barrier.  A: Staff to monitor Q 15 mins for safety. Encouragement and support offered. Scheduled medications administered per orders. R: Patient remains safe on the unit. Patient attended group tonight. Patient visible on hte unit and interacting with peers. Patient taking administered medications.

## 2014-10-08 NOTE — [facility] Counselor (Signed)
CSW attempted to meet with Pt along with interpreter. Pt was observed to be laying in bed with her hand over her face and would not respond to multiple prompts. CSW will attempt PSA at later time.  Chad Cordial, LCSWA Clinical Social Work [phone]

## 2014-10-08 NOTE — [facility] Group Notes (Signed)
[facility] LCSW Group Therapy 10/08/2014 1:15 PM  Type of Therapy: Group Therapy- Feelings about Diagnosis  Pt did not attend, declined invitation.   Chad Cordial, LCSWA 10/08/2014 2:51 PM

## 2014-10-08 NOTE — Progress Notes (Signed)
Adult Psychoeducational Group Note  Date:  10/08/2014 Time:  9:06 PM  Group Topic/Focus:  Wrap-Up Group:   The focus of this group is to help patients review their daily goal of treatment and discuss progress on daily workbooks.  Participation Level:  Active  Participation Quality:  Appropriate  Affect:  Appropriate  Cognitive:  Alert  Insight: Appropriate  Engagement in Group:  Engaged  Modes of Intervention:  Discussion  Additional Comments:  Patient stated having an "ok" day. Patient stated she's feeling more comfortable and still trying to re-cooperate.   Taffney L Deas 10/08/2014, 9:06 PM

## 2014-10-08 NOTE — BHH Suicide Risk Assessment (Signed)
[facility] Admission Suicide Risk Assessment   Nursing information obtained from:    Demographic factors:    Current Mental Status:    Loss Factors:    Historical Factors:    Risk Reduction Factors:    Total Time spent with patient: 45 minutes Principal Problem: Severe recurrent major depression without psychotic features Diagnosis:   Patient Active Problem List   Diagnosis Date Noted  . 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 Depression [F32.9] 10/07/2014  . Poor dentition [K08.8] 08/01/2014  . Decreased visual acuity [H54.7] 08/01/2014  . Sinus bradycardia [R00.1] 04/12/2014  . Chronic diarrhea [K52.9] 03/19/2014  . Bradycardia [R00.1] 02/19/2014  . Cataract [H26.9] 02/19/2014  . Bartholin gland cyst [N75.0] 02/19/2014  . Right sided abdominal pain [R10.9] 01/29/2014  . Allergic rhinoconjunctivitis of both eyes [J30.9, H10.13] 01/29/2014  . Vitamin D deficiency [E55.9] 01/29/2014  . SOB (shortness of breath) [R06.02] 01/29/2014  . Tingling [R20.2] 01/29/2014  . Spanish speaking patient [R47.89] 01/29/2014  . Osteoarthritis of left hip [M16.12] 01/29/2014  . DJD (degenerative joint disease), lumbar [M47.816] 01/29/2014  . Umbilical hernia [K42.9] 01/29/2014  . Hypertension [I10]   . GERD (gastroesophageal reflux disease) [K21.9]      Continued Clinical Symptoms:    The "Alcohol Use Disorders Identification Test", Guidelines for Use in Primary Care, Second Edition.  World Science writer [facility]). Score between 0-7:  no or low risk or alcohol related problems. Score between 8-15:  moderate risk of alcohol related problems. Score between 16-19:  high risk of alcohol related problems. Score 20 or above:  warrants further diagnostic evaluation for alcohol dependence and treatment.   CLINICAL FACTORS:   Depression:   Severe  Psychiatric Specialty Exam: Physical Exam  ROS  Blood pressure 128/60, pulse 71, temperature 98 F (36.7 C),  temperature source Oral, resp. rate 18, height 5' (1.524 m), weight 54 kg (119 lb 0.8 oz), SpO2 100 %.Body mass index is 23.25 kg/(m^2).    COGNITIVE FEATURES THAT CONTRIBUTE TO RISK:  Closed-mindedness, Polarized thinking and Thought constriction (tunnel vision)    SUICIDE RISK:   Moderate:  Frequent suicidal ideation with limited intensity, and duration, some specificity in terms of plans, no associated intent, good self-control, limited dysphoria/symptomatology, some risk factors present, and identifiable protective factors, including available and accessible social support.  PLAN OF CARE: see admission H and PE  Medical Decision Making:  New problem, with additional work up planned, Review of Psycho-Social Stressors (1), Review of Medication Regimen & Side Effects (2) and Review of New Medication or Change in Dosage (2)  I certify that inpatient services furnished can reasonably be expected to improve the patient's condition.   LUGO,IRVING A 10/08/2014, 6:38 PM

## 2014-10-08 NOTE — Tx Team (Signed)
Interdisciplinary Treatment Plan Update (Adult) Date: 10/08/2014   Date: 10/08/2014 8:45 AM  Progress in Treatment:  Attending groups: Pt is new to milieu, continuing to assess  Participating in groups: Pt is new to milieu, continuing to assess  Taking medication as prescribed: Yes  Tolerating medication: Yes  Family/Significant othe contact made: No, CSW assessing for appropriate contact Patient understands diagnosis: Continuing to assess Discussing patient identified problems/goals with staff: Yes  Medical problems stabilized or resolved: Yes  Denies suicidal/homicidal ideation: Yes Patient has not harmed self or Others: Yes   New problem(s) identified: None identified at this time.   Discharge Plan or Barriers: CSW will assess for appropriate discharge plan and relevant barriers.   Additional comments: n/a   Reason for Continuation of Hospitalization:  Depression Medication stabilization Suicidal ideation  Estimated length of stay: 3-5 days  Review of initial/current patient goals per problem list:   1.  Goal(s): Patient will participate in aftercare plan  Met:  No  Target date: 3-5 days from date of admission   As evidenced by: Patient will participate within aftercare plan AEB aftercare provider and housing plan at discharge being identified.  10/08/14: CSW to work with Pt to assess for appropriate discharge plan and faciliate appointments and referrals as needed prior to d/c.  2.  Goal (s): Patient will exhibit decreased depressive symptoms and suicidal ideations.  Met:  No  Target date: 3-5 days from date of admission   As evidenced by: Patient will utilize self rating of depression at 3 or below and demonstrate decreased signs of depression or be deemed stable for discharge by MD. 10/08/14: Pt was admitted with symptoms of depression, rating 10/10. Pt continues to present with flat affect and depressive symptoms.  Pt will demonstrate decreased symptoms of  depression and rate depression at 3/10 or lower prior to discharge.  Attendees:  Patient:    Family:    Physician: Dr. Parke Poisson, MD  10/08/2014 8:45 AM  Nursing: Lars Pinks, RN Case manager  10/08/2014 8:45 AM  Clinical Social Worker Norman Clay, MSW 10/08/2014 8:45 AM  Other: Lucinda Dell, Beverly Sessions Liasion 10/08/2014 8:45 AM  Clinical: Gaylan Gerold, RN; Hedy Jacob, RN 10/08/2014 8:45 AM  Other: Loletta Specter, RN Charge Nurse 10/08/2014 8:45 AM  Other:     Peri Maris, Latanya Presser MSW

## 2014-10-09 ENCOUNTER — Encounter (HOSPITAL_COMMUNITY): Payer: Self-pay | Admitting: *Deleted

## 2014-10-09 ENCOUNTER — Inpatient Hospital Stay (HOSPITAL_COMMUNITY): Payer: Federal, State, Local not specified - Other

## 2014-10-09 ENCOUNTER — Encounter (HOSPITAL_COMMUNITY): Payer: Self-pay

## 2014-10-09 ENCOUNTER — Inpatient Hospital Stay (HOSPITAL_COMMUNITY)
Admission: AD | Admit: 2014-10-09 | Discharge: 2014-10-16 | DRG: 312 | Disposition: A | Discharge status: Other Acute Inpatient Hospital | Payer: Federal, State, Local not specified - Other | Source: Other Acute Inpatient Hospital | Attending: Internal Medicine | Admitting: Internal Medicine

## 2014-10-09 ENCOUNTER — Inpatient Hospital Stay (HOSPITAL_COMMUNITY)
Admission: AD | Admit: 2014-10-09 | Discharge: 2014-10-09 | Disposition: A | Discharge status: Home / Self Care | Payer: Federal, State, Local not specified - Other | Source: Intra-hospital | Attending: Internal Medicine | Admitting: Internal Medicine

## 2014-10-09 DIAGNOSIS — K219 Gastro-esophageal reflux disease without esophagitis: Secondary | ICD-10-CM | POA: Diagnosis present

## 2014-10-09 DIAGNOSIS — G9341 Metabolic encephalopathy: Secondary | ICD-10-CM

## 2014-10-09 DIAGNOSIS — I10 Essential (primary) hypertension: Secondary | ICD-10-CM | POA: Diagnosis present

## 2014-10-09 DIAGNOSIS — Z79899 Other long term (current) drug therapy: Secondary | ICD-10-CM

## 2014-10-09 DIAGNOSIS — F332 Major depressive disorder, recurrent severe without psychotic features: Secondary | ICD-10-CM | POA: Diagnosis present

## 2014-10-09 DIAGNOSIS — Z8249 Family history of ischemic heart disease and other diseases of the circulatory system: Secondary | ICD-10-CM

## 2014-10-09 DIAGNOSIS — N390 Urinary tract infection, site not specified: Secondary | ICD-10-CM | POA: Diagnosis present

## 2014-10-09 DIAGNOSIS — E860 Dehydration: Secondary | ICD-10-CM | POA: Diagnosis present

## 2014-10-09 DIAGNOSIS — G4700 Insomnia, unspecified: Secondary | ICD-10-CM | POA: Diagnosis present

## 2014-10-09 DIAGNOSIS — Z886 Allergy status to analgesic agent status: Secondary | ICD-10-CM

## 2014-10-09 DIAGNOSIS — J309 Allergic rhinitis, unspecified: Secondary | ICD-10-CM | POA: Diagnosis present

## 2014-10-09 DIAGNOSIS — R55 Syncope and collapse: Secondary | ICD-10-CM | POA: Diagnosis present

## 2014-10-09 DIAGNOSIS — Z87891 Personal history of nicotine dependence: Secondary | ICD-10-CM | POA: Diagnosis not present

## 2014-10-09 DIAGNOSIS — B370 Candidal stomatitis: Secondary | ICD-10-CM | POA: Diagnosis present

## 2014-10-09 DIAGNOSIS — F32A Depression, unspecified: Secondary | ICD-10-CM | POA: Diagnosis present

## 2014-10-09 DIAGNOSIS — F329 Major depressive disorder, single episode, unspecified: Secondary | ICD-10-CM | POA: Diagnosis present

## 2014-10-09 LAB — COMPREHENSIVE METABOLIC PANEL
ALK PHOS: 89 U/L (ref 38–126)
ALT: 59 U/L — ABNORMAL HIGH (ref 14–54)
ANION GAP: 10 (ref 5–15)
AST: 62 U/L — ABNORMAL HIGH (ref 15–41)
Albumin: 3.6 g/dL (ref 3.5–5.0)
BUN: 14 mg/dL (ref 6–20)
CALCIUM: 9 mg/dL (ref 8.9–10.3)
CHLORIDE: 95 mmol/L — AB (ref 101–111)
CO2: 28 mmol/L (ref 22–32)
Creatinine, Ser: 0.6 mg/dL (ref 0.44–1.00)
GFR calc non Af Amer: >60 mL/min (ref >60)
Glucose, Bld: 113 mg/dL — ABNORMAL HIGH (ref 65–99)
POTASSIUM: 3.6 mmol/L (ref 3.5–5.1)
SODIUM: 133 mmol/L — AB (ref 135–145)
Total Bilirubin: 0.6 mg/dL (ref 0.3–1.2)
Total Protein: 7.4 g/dL (ref 6.5–8.1)

## 2014-10-09 LAB — TROPONIN I

## 2014-10-09 LAB — CBC
HCT: 43.2 % (ref 36.0–46.0)
HEMATOCRIT: 40 % (ref 36.0–46.0)
HEMOGLOBIN: 14.9 g/dL (ref 12.0–15.0)
HEMOGLOBIN: 13.8 g/dL (ref 12.0–15.0)
MCH: 29.7 pg (ref 26.0–34.0)
MCH: 29.7 pg (ref 26.0–34.0)
MCHC: 34.5 g/dL (ref 30.0–36.0)
MCHC: 34.5 g/dL (ref 30.0–36.0)
MCV: 86.1 fL (ref 78.0–100.0)
MCV: 86 fL (ref 78.0–100.0)
Platelets: 318 10*3/uL (ref 150–400)
Platelets: 309 10*3/uL (ref 150–400)
RBC: 5.02 MIL/uL (ref 3.87–5.11)
RBC: 4.65 MIL/uL (ref 3.87–5.11)
RDW: 12.7 % (ref 11.5–15.5)
RDW: 12.9 % (ref 11.5–15.5)
WBC: 8.8 10*3/uL (ref 4.0–10.5)
WBC: 8.6 10*3/uL (ref 4.0–10.5)

## 2014-10-09 LAB — MAGNESIUM: Magnesium: 1.9 mg/dL (ref 1.7–2.4)

## 2014-10-09 LAB — CREATININE, SERUM: Creatinine, Ser: 0.57 mg/dL (ref 0.44–1.00)

## 2014-10-09 LAB — GLUCOSE, CAPILLARY: GLUCOSE-CAPILLARY: 122 mg/dL — AB (ref 65–99)

## 2014-10-09 LAB — TSH: TSH: 0.831 u[IU]/mL (ref 0.350–4.500)

## 2014-10-09 LAB — PROTIME-INR
INR: 1.05 (ref 0.00–1.49)
Prothrombin Time: 13.9 seconds (ref 11.6–15.2)

## 2014-10-09 LAB — I-STAT TROPONIN, ED: TROPONIN I, POC: 0.01 ng/mL (ref 0.00–0.08)

## 2014-10-09 LAB — CBG MONITORING, ED: GLUCOSE-CAPILLARY: 102 mg/dL — AB (ref 65–99)

## 2014-10-09 MED ORDER — ENOXAPARIN SODIUM 40 MG/0.4ML ~~LOC~~ SOLN
40.0000 mg | SUBCUTANEOUS | Status: DC
  Administered 2014-10-09 – 2014-10-15 (×6): 40 mg via SUBCUTANEOUS
  Filled 2014-10-09 (×8): qty 0.4

## 2014-10-09 MED ORDER — CEPHALEXIN 500 MG PO CAPS
500.0000 mg | ORAL_CAPSULE | Freq: Two times a day (BID) | ORAL | Status: DC
Start: 2014-10-09 — End: 2014-10-09

## 2014-10-09 MED ORDER — CALCIUM CARBONATE-VITAMIN D 500-200 MG-UNIT PO TABS
1.0000 | ORAL_TABLET | Freq: Every day | ORAL | Status: DC
  Filled 2014-10-09: qty 1

## 2014-10-09 MED ORDER — LEVALBUTEROL HCL 0.63 MG/3ML IN NEBU: 0.6300 mg | INHALATION_SOLUTION | Freq: Four times a day (QID) | RESPIRATORY_TRACT | Status: DC | PRN

## 2014-10-09 MED ORDER — SODIUM CHLORIDE 0.9 % IV SOLN
INTRAVENOUS | Status: DC
Start: 2014-10-09 — End: 2014-10-09
  Administered 2014-10-09: 14:00:00 via INTRAVENOUS

## 2014-10-09 MED ORDER — ACETAMINOPHEN 325 MG PO TABS: 650.0000 mg | ORAL_TABLET | Freq: Four times a day (QID) | ORAL | Status: DC | PRN

## 2014-10-09 MED ORDER — ONDANSETRON HCL 4 MG PO TABS
4.0000 mg | ORAL_TABLET | Freq: Four times a day (QID) | ORAL | Status: DC | PRN
Start: 2014-10-09 — End: 2014-10-09

## 2014-10-09 MED ORDER — SODIUM CHLORIDE 0.9 % IV BOLUS (SEPSIS)
1000.0000 mL | Freq: Once | INTRAVENOUS | Status: AC
  Administered 2014-10-09: 1000 mL via INTRAVENOUS

## 2014-10-09 MED ORDER — SODIUM CHLORIDE 0.9 % IJ SOLN: 3.0000 mL | Freq: Two times a day (BID) | INTRAMUSCULAR | Status: DC

## 2014-10-09 MED ORDER — ACETAMINOPHEN 650 MG RE SUPP: 650.0000 mg | Freq: Four times a day (QID) | RECTAL | Status: DC | PRN

## 2014-10-09 MED ORDER — ONDANSETRON HCL 4 MG PO TABS: 4.0000 mg | ORAL_TABLET | Freq: Four times a day (QID) | ORAL | Status: DC | PRN

## 2014-10-09 MED ORDER — ONDANSETRON HCL 4 MG/2ML IJ SOLN: 4.0000 mg | Freq: Four times a day (QID) | INTRAMUSCULAR | Status: DC | PRN

## 2014-10-09 MED ORDER — DEXTROSE 5 % IV SOLN: 1.0000 g | INTRAVENOUS | Status: DC

## 2014-10-09 MED ORDER — DEXTROSE 5 % IV SOLN
1.0000 g | INTRAVENOUS | Status: DC
  Administered 2014-10-09: 1 g via INTRAVENOUS
  Filled 2014-10-09: qty 10

## 2014-10-09 MED ORDER — CEPHALEXIN 500 MG PO CAPS: 500.0000 mg | ORAL_CAPSULE | Freq: Two times a day (BID) | ORAL | Status: DC

## 2014-10-09 MED ORDER — HYDROCHLOROTHIAZIDE 25 MG PO TABS
25.0000 mg | ORAL_TABLET | Freq: Every day | ORAL | Status: DC
  Filled 2014-10-09: qty 1

## 2014-10-09 MED ORDER — SERTRALINE HCL 50 MG PO TABS
25.0000 mg | ORAL_TABLET | Freq: Every day | ORAL | Status: DC
  Filled 2014-10-09: qty 1

## 2014-10-09 MED ORDER — ENOXAPARIN SODIUM 40 MG/0.4ML ~~LOC~~ SOLN
40.0000 mg | SUBCUTANEOUS | Status: DC
  Filled 2014-10-09 (×2): qty 0.4

## 2014-10-09 MED ORDER — CYANOCOBALAMIN 500 MCG PO TABS
500.0000 ug | ORAL_TABLET | Freq: Every day | ORAL | Status: DC
  Filled 2014-10-09: qty 1

## 2014-10-09 MED ORDER — LISINOPRIL 20 MG PO TABS
20.0000 mg | ORAL_TABLET | Freq: Every day | ORAL | Status: DC
  Filled 2014-10-09: qty 1

## 2014-10-09 NOTE — H&P (Addendum)
[facility] History and Physical  Barbara Beck ZOX:[id number] DOB: September 22, 1927 DOA: 10/07/2014  Referring physician:  PCP: Lora Paula, MD   Chief Complaint: Suicidal ideation/syncope  HPI:  81 year old female with multiple visits to the ER over the last several months for several reasons most recently last 2 visits related to depression, suicidal ideation, patient recently moved to [facility] in the last night and wants to stay with her family. Patient has not been able to adapt to her surroundings. She was to senior center during the day when her kids are at work. She has been more depressed and has lost interest in her activities of daily living. She has a poor appetite and has lost 50 pounds since January 2016. She has crying episodes at home but denies any delusions or hallucinations. Patient was admitted to behavioral health on 9/20, she refused her morning medications today, apparently became unresponsive in the bathroom but shallow breathing. Blood pressure at be a change was 140/100. Tachycardic in the 140s. Pulse ox 98% She also became hypoxic and was transferred to the ER for further evaluation.Pt noted to have excessive drooling, right sided mouth drooping after spitting out bottom dentures.Pt have been refusing to eat, take meds, drink fluids , unarousable in the ER at the time of my exam .       Review of Systems: negative for the following    Unable to perform ROS: Psychiatric disorder    Past Medical History  Diagnosis Date  . Hypertension ?   Marland Kitchen GERD (gastroesophageal reflux disease) ?   Marland Kitchen Suicidal ideation      Past Surgical History  Procedure Laterality Date  . Eye surgery Left     Cataract Surgery       Social History:  reports that she quit smoking about 30 years ago. She has never used smokeless tobacco. She reports that she does not drink alcohol or use illicit drugs.    Allergies  Allergen Reactions  . Aspirin Hives and Nausea Only     NOTHING THAT CONTAINS ASPIRIN    Family History  Problem Relation Age of Onset  . Cancer Daughter     breast with bone mets   . Hypertension Father           Prior to Admission medications   Medication Sig Start Date End Date Taking? Authorizing Provider  acetaminophen (TYLENOL) 325 MG tablet Take 2 tablets (650 mg total) by mouth 3 (three) times daily as needed for moderate pain. 03/11/14   Josalyn Funches, MD  acetaminophen-codeine (TYLENOL #3) 300-30 MG per tablet Take 1 tablet by mouth every 8 (eight) hours as needed for moderate pain or severe pain. 08/08/14   Josalyn Funches, MD  amLODipine (NORVASC) 10 MG tablet Take 1 tablet (10 mg total) by mouth daily. 08/08/14   Josalyn Funches, MD  Calcium Carb-Cholecalciferol (CALCIUM-VITAMIN D) 600-400 MG-UNIT TABS Take 1 tablet by mouth daily.     Historical Provider, MD  cyanocobalamin 500 MCG tablet Take 1 tablet (500 mcg total) by mouth daily. 02/28/14   Josalyn Funches, MD  fluticasone (FLONASE) 50 MCG/ACT nasal spray Place 2 sprays into both nostrils daily. Patient not taking: Reported on 09/19/2014 01/29/14   Dessa Phi, MD  lisinopril-hydrochlorothiazide (PRINZIDE,ZESTORETIC) 20-25 MG per tablet Take 1 tablet by mouth daily. 03/19/14   Josalyn Funches, MD  pyridOXINE (B-6) 50 MG tablet Take 1 tablet (50 mg total) by mouth daily. 02/28/14   Josalyn Funches, MD  ranitidine (ZANTAC) 150 MG tablet TAKE  1 TABLET BY MOUTH 2 TIMES DAILY Patient taking differently: TAKE 1 TABLET BY MOUTH AT BEDTIME 08/02/14   Josalyn Funches, MD  Thiamine 50 MG CAPS Take 1 capsule (50 mg total) by mouth daily. 02/28/14   Dessa Phi, MD     Physical Exam: Filed Vitals:   10/07/14 1600 10/08/14 1642 10/09/14 0955 10/09/14 1043  BP: 114/45 128/60 148/115 174/65  Pulse: 78 71 135 59  Temp: 98 F (36.7 C)   98.1 F (36.7 C)  TempSrc: Oral   Oral  Resp: 18   20  Height: 5' (1.524 m)     Weight: 54 kg (119 lb 0.8 oz)     SpO2: 100%   98%      Constitutional: Vital signs reviewed. Thin, dry mucous membranes , confused Head: Normocephalic and atraumatic  Ear: TM normal bilaterally  Mouth: no erythema or exudates, MMM  Eyes: PERRL, EOMI, conjunctivae normal, No scleral icterus.  Neck: Supple, Trachea midline normal ROM, No JVD, mass, thyromegaly, or carotid bruit present.  Cardiovascular: RRR, S1 normal, S2 normal, no MRG, pulses symmetric and intact bilaterally  Pulmonary/Chest: CTAB, no wheezes, rales, or rhonchi  Abdominal: Soft. Non-tender, non-distended, bowel sounds are normal, no masses, organomegaly, or guarding present.  GU: no CVA tenderness Musculoskeletal: Normal range of motion. She exhibits no edema.  Patient has global weakness.  Neurological: She is oriented to person, place, and time. No cranial nerve deficit.  Patient unable to hold her arms up longer than 3 seconds, unsure whether this is volitional. Hematology: no cervical, inginal, or axillary adenopathy.  Neurological: A&O x3, Strenght is normal and symmetric bilaterally, cranial nerve II-XII are grossly intact, no focal motor deficit, sensory intact to light touch bilaterally.  Skin: Warm, dry and intact. No rash, cyanosis, or clubbing.  Psychiatric: Normal mood and affect. speech and behavior is normal. Judgment and thought content normal. Cognition and memory are normal.      Data Review   Micro Results No results found for this or any previous visit (from the past 240 hour(s)).  Radiology Reports Dg Chest 2 View  10/06/2014   CLINICAL DATA:  History of hypertension, psychiatric placement  EXAM: CHEST  2 VIEW  COMPARISON:  09/19/2014  FINDINGS: Mild cardiac enlargement stable. Stable aortic arch calcification. Vascular pattern normal. Mild chronic bronchitic change. No consolidation or effusion.  IMPRESSION: No active cardiopulmonary disease.   Electronically Signed   By: Esperanza Heir M.D.   On: 10/06/2014 13:54   Dg Chest 2 View  09/19/2014    CLINICAL DATA:  Acute onset of lightheadedness, lethargy, diarrhea and shortness of breath. Initial encounter.  EXAM: CHEST  2 VIEW  COMPARISON:  Chest radiograph from 01/29/2014  FINDINGS: The lungs are well-aerated and clear. There is no evidence of focal opacification, pleural effusion or pneumothorax.  The heart is borderline normal in size. No acute osseous abnormalities are seen.  IMPRESSION: No acute cardiopulmonary process seen.   Electronically Signed   By: Roanna Raider M.D.   On: 09/19/2014 20:34   Ct Head Wo Contrast  10/09/2014   CLINICAL DATA:  Acute onset of unresponsiveness. Excessive drooling with fall. Initial encounter.  EXAM: CT HEAD WITHOUT CONTRAST  TECHNIQUE: Contiguous axial images were obtained from the base of the skull through the vertex without intravenous contrast.  COMPARISON:  None.  FINDINGS: There is no evidence of acute intracranial hemorrhage, mass lesion, brain edema or extra-axial fluid collection. The ventricles and subarachnoid spaces are appropriately sized for  age. There is no CT evidence of acute cortical infarction. There is evidence of minimal chronic periventricular white matter disease. Intracranial vascular calcifications are noted.  The visualized paranasal sinuses, mastoid air cells and middle ears are clear. The calvarium is intact.  IMPRESSION: No acute intracranial findings or explanation for the patient's symptoms.   Electronically Signed   By: Carey Bullocks M.D.   On: 10/09/2014 11:59     CBC  Recent Labs Lab 10/05/14 1650 10/09/14 1107  WBC 9.5 8.8  HGB 14.3 14.9  HCT 42.1 43.2  PLT 330 318  MCV 87.0 86.1  MCH 29.5 29.7  MCHC 34.0 34.5  RDW 12.9 12.7    Chemistries   Recent Labs Lab 10/05/14 1650 10/09/14 1107  NA 130* 133*  K 3.7 3.6  CL 94* 95*  CO2 27 28  GLUCOSE 143* 113*  BUN 11 14  CREATININE 0.80 0.60  CALCIUM 9.0 9.0  AST 28 62*  ALT 19 59*  ALKPHOS 92 89  BILITOT 0.6 0.6    ------------------------------------------------------------------------------------------------------------------ estimated creatinine clearance is 36.9 mL/min (by C-G formula based on Cr of 0.6). ------------------------------------------------------------------------------------------------------------------ No results for input(s): HGBA1C in the last 72 hours. ------------------------------------------------------------------------------------------------------------------ No results for input(s): CHOL, HDL, LDLCALC, TRIG, CHOLHDL, LDLDIRECT in the last 72 hours. ------------------------------------------------------------------------------------------------------------------ No results for input(s): TSH, T4TOTAL, T3FREE, THYROIDAB in the last 72 hours.  Invalid input(s): FREET3 ------------------------------------------------------------------------------------------------------------------ No results for input(s): VITAMINB12, FOLATE, FERRITIN, TIBC, IRON, RETICCTPCT in the last 72 hours.  Coagulation profile  Recent Labs Lab 10/09/14 1205  INR 1.05    No results for input(s): DDIMER in the last 72 hours.  Cardiac Enzymes No results for input(s): CKMB, TROPONINI, MYOGLOBIN in the last 168 hours.  Invalid input(s): CK ------------------------------------------------------------------------------------------------------------------ Invalid input(s): POCBNP   CBG:  Recent Labs Lab 10/09/14 1003 10/09/14 1120  GLUCAP 122* 102*       EKG: Independently reviewed. Pending   Assessment/Plan Principal Problem:   Severe recurrent major depression without psychotic features-patient transferred from [facility] to [facility] and will be admitted to telemetry service for evaluation of her syncope. Will appreciate a site continues to follow the patient here   UTI-start the patient on Rocephin     Syncope and collapse-CT without contrast negative, repeat chest x-ray to rule  out pneumonia, patient tachycardic with a heart rate of 140, obtain EKG and admitted to telemetry, obtain 2-D echo, TSH, cycle cardiac enzymes, MRI of the brain to rule out CVA  Hypertension continue Norvasc and prn hydralazine  Dehydration due to poor oral intake secondary to depression, started the patient on IV fluids  Gastro esophageal reflux disease-continue PPI   Code Status:   Full, tried to reach the daughter to confirm code status , left voicemail, Family Communication: bedside Disposition Plan: admit   Total time spent 55 minutes.Greater than 50% of this time was spent in counseling, explanation of diagnosis, planning of further management, and coordination of care  [facility] Pager [phone]  If 7PM-7AM, please contact night-coverage [email or web address] Password [email or web address] 10/09/2014, 1:26 PM

## 2014-10-09 NOTE — ED Notes (Signed)
Patient transported to MRI 

## 2014-10-09 NOTE — ED Provider Notes (Signed)
CSN: [id number]     Arrival date & time 10/09/14  1042 History   First MD Initiated Contact with Patient 10/09/14 1110     Chief Complaint  Patient presents with  . Altered Mental Status     (Consider location/radiation/quality/duration/timing/severity/associated sxs/prior Treatment) HPI   She is an 81 year old female brought here from [facility] because of syncopal episode. Patient was reportedly been forced take a bath and then she became weak all over had drooping of her left face and drooling. It is unclear whether was volitional or true loss of consciousness. Patient slowly woke up and then was brought here for evaluation. Patient is at [facility] because he only brought her from [place] and patient is suicidal, with a lot of negative symptoms including disinterest in interacting with those around her.  Level V caveat psychiatric disease, nonverbal  Past Medical History  Diagnosis Date  . Hypertension ?   Marland Kitchen GERD (gastroesophageal reflux disease) ?   Marland Kitchen Suicidal ideation    Past Surgical History  Procedure Laterality Date  . Eye surgery Left     Cataract Surgery    Family History  Problem Relation Age of Onset  . Cancer Daughter     breast with bone mets   . Hypertension Father    Social History  Substance Use Topics  . Smoking status: Former Smoker -- 2.00 packs/day for 13 years    Quit date: 01/19/1984  . Smokeless tobacco: Never Used  . Alcohol Use: No   OB History    No data available     Review of Systems  Unable to perform ROS: Psychiatric disorder  Skin: Negative for rash.      Allergies  Aspirin  Home Medications   Prior to Admission medications   Medication Sig Start Date End Date Taking? Authorizing Provider  acetaminophen (TYLENOL) 325 MG tablet Take 2 tablets (650 mg total) by mouth 3 (three) times daily as needed for moderate pain. 03/11/14   Josalyn Funches, MD  acetaminophen-codeine (TYLENOL #3) 300-30 MG per tablet Take 1 tablet by mouth  every 8 (eight) hours as needed for moderate pain or severe pain. 08/08/14   Josalyn Funches, MD  amLODipine (NORVASC) 10 MG tablet Take 1 tablet (10 mg total) by mouth daily. 08/08/14   Josalyn Funches, MD  Calcium Carb-Cholecalciferol (CALCIUM-VITAMIN D) 600-400 MG-UNIT TABS Take 1 tablet by mouth daily.     Historical Provider, MD  cyanocobalamin 500 MCG tablet Take 1 tablet (500 mcg total) by mouth daily. 02/28/14   Josalyn Funches, MD  fluticasone (FLONASE) 50 MCG/ACT nasal spray Place 2 sprays into both nostrils daily. Patient not taking: Reported on 09/19/2014 01/29/14   Dessa Phi, MD  lisinopril-hydrochlorothiazide (PRINZIDE,ZESTORETIC) 20-25 MG per tablet Take 1 tablet by mouth daily. 03/19/14   Josalyn Funches, MD  pyridOXINE (B-6) 50 MG tablet Take 1 tablet (50 mg total) by mouth daily. 02/28/14   Josalyn Funches, MD  ranitidine (ZANTAC) 150 MG tablet TAKE 1 TABLET BY MOUTH 2 TIMES DAILY Patient taking differently: TAKE 1 TABLET BY MOUTH AT BEDTIME 08/02/14   Josalyn Funches, MD  Thiamine 50 MG CAPS Take 1 capsule (50 mg total) by mouth daily. 02/28/14   Josalyn Funches, MD   BP 143/57 mmHg  Pulse 59  Temp(Src) 98.1 F (36.7 C) (Oral)  Resp 45  Ht 5' (1.524 m)  Wt 119 lb 0.8 oz (54 kg)  BMI 23.25 kg/m2  SpO2 96% Physical Exam  Constitutional: She is oriented to person,  place, and time. She appears well-developed.  Thin, dry mucous membranes  HENT:  Head: Normocephalic and atraumatic.  Eyes: Conjunctivae are normal. Right eye exhibits no discharge.  Neck: Neck supple.  Cardiovascular: Normal rate, regular rhythm and normal heart sounds.   No murmur heard. Pulmonary/Chest: Effort normal and breath sounds normal. She has no wheezes. She has no rales.  Abdominal: Soft. She exhibits no distension. There is no tenderness.  Musculoskeletal: Normal range of motion. She exhibits no edema.  Patient has global weakness.  Neurological: She is oriented to person, place, and time. No  cranial nerve deficit.  Patient unable to hold her arms up longer than 3 seconds, unsure whether this is volitional.  Skin: Skin is warm and dry. No rash noted. She is not diaphoretic.  Psychiatric:  Patient stating that she wants to die.  Nursing note and vitals reviewed.   ED Course  Procedures (including critical care time) Labs Review Labs Reviewed  GLUCOSE, CAPILLARY - Abnormal; Notable for the following:    Glucose-Capillary 122 (*)    All other components within normal limits  COMPREHENSIVE METABOLIC PANEL - Abnormal; Notable for the following:    Sodium 133 (*)    Chloride 95 (*)    Glucose, Bld 113 (*)    AST 62 (*)    ALT 59 (*)    All other components within normal limits  CBG MONITORING, ED - Abnormal; Notable for the following:    Glucose-Capillary 102 (*)    All other components within normal limits  URINE CULTURE  CBC  PROTIME-INR  MAGNESIUM  TSH  TROPONIN I  URINALYSIS, ROUTINE W REFLEX MICROSCOPIC (NOT AT [facility])  URINE RAPID DRUG SCREEN, HOSP PERFORMED  TROPONIN I  I-STAT TROPOININ, ED    Imaging Review Ct Head Wo Contrast  10/09/2014   CLINICAL DATA:  Acute onset of unresponsiveness. Excessive drooling with fall. Initial encounter.  EXAM: CT HEAD WITHOUT CONTRAST  TECHNIQUE: Contiguous axial images were obtained from the base of the skull through the vertex without intravenous contrast.  COMPARISON:  None.  FINDINGS: There is no evidence of acute intracranial hemorrhage, mass lesion, brain edema or extra-axial fluid collection. The ventricles and subarachnoid spaces are appropriately sized for age. There is no CT evidence of acute cortical infarction. There is evidence of minimal chronic periventricular white matter disease. Intracranial vascular calcifications are noted.  The visualized paranasal sinuses, mastoid air cells and middle ears are clear. The calvarium is intact.  IMPRESSION: No acute intracranial findings or explanation for the patient's symptoms.    Electronically Signed   By: Carey Bullocks M.D.   On: 10/09/2014 11:59   Portable Chest 1 View  10/09/2014   CLINICAL DATA:  Syncope, unresponsive for 5 minutes  EXAM: PORTABLE CHEST - 1 VIEW  COMPARISON:  10/06/2014  FINDINGS: Limited inspiratory effect. Heart size and vascular pattern are normal. Mild to moderate chronic appearing bronchitic change. No consolidation or effusion.  IMPRESSION: No active disease.   Electronically Signed   By: Esperanza Heir M.D.   On: 10/09/2014 13:55   I have personally reviewed and evaluated these images and lab results as part of my medical decision-making.   EKG Interpretation   Date/Time:  Wednesday October 09 2014 10:42:41 EDT Ventricular Rate:  66 PR Interval:  195 QRS Duration: 87 QT Interval:  439 QTC Calculation: 460 R Axis:   12 Text Interpretation:  Sinus rhythm Probable left ventricular hypertrophy  Nonspecific T abnormalities, lateral leads no acute ischemia.  No  significant change since last tracing Confirmed by Kandis Mannan  [phone]) on 10/09/2014 1:00:15 PM      MDM   Final diagnoses:  Syncope  Syncope and collapse  Magnetic resonance imaging contraindicated due to metal implant   patient is a 81 year old female presenting today from [facility] with syncope. Unclear whether this is true syncope or represents a volitional lack of response. However patient had whole-body weakness and unresponsiveness. We will get CT scan to make sure patient does not have intracranial hemorrhage, neurocystero etc. We will do swallow sttudy and workup for TIA given left facial droop. We will get troponin and EKG.  I think patient will require admission for syncope and then will need placement back to [facility] since patient is continuing to express suicidality.  Will admit for syncope work up.  Courteney Randall An, MD 10/09/14 [phone]

## 2014-10-09 NOTE — Progress Notes (Signed)
Pt refused morning meds and vital signs this morning. Dub Mikes MD., made aware. Pt previous RN, reported that pt daughter in-law stated that pt take meds everyday at 6 pm.  Writer communicated message to Luis M. Cintron, MD.,. Scheduled daily meds modified to 1800 per MD.

## 2014-10-09 NOTE — B[facility] Group Notes (Signed)
[facility] LCSW Aftercare Discharge Planning Group Note  10/09/2014 8:45 AM  Pt did not attend, declined invitation.   Chad Cordial, LCSWA 10/09/2014 9:16 AM

## 2014-10-09 NOTE — Progress Notes (Addendum)
Report called to Amy, RN., charge at [facility]. At approximately 0950 am, pt became unresponsive while in the bathtub. Pt noted to have excessive drooling, right sided mouth drooping after spitting out bottom dentures. Pt v/s assessed at that time b/p 148/115, O2 sat 98%, pulse 135, CBG 122. Dub Mikes, MD notified and May, NP notified. Pt had to be transferred using a three person assist to wheelchair at that time. Pt assessed by May, NP., and ordered to be sent over to ED to be assessed for change in mental status. Pt have been refusing to eat, take meds, drink fluids or have v/s assessed. Pt was initially observed lying in bed in urine and feces this morning during assessment.  Right before EMS arrived to unit, pt stated, "I'm tired of living, I don't want to live anymore". Sitter transported with pt to [facility].

## 2014-10-09 NOTE — Progress Notes (Signed)
Writer notified Cecelia at [phone]. No answer, vm left.

## 2014-10-09 NOTE — Progress Notes (Signed)
Barbara Beck was found in the bathroom with shallow breathing.  Per nursing, her mentation and LOC was altered.  Patient visibly not responding as alert.  Vital signs taken and BP was in the 140/100's.  She was tachycardic at 140's.  Oximeter unable to read a oxygen level.  It took three staff members (in a three man left) to include this NP to get patient from tub to wheelchair.  EMS called to transfer patient to the ED.   I agree with assessment and plan Barbara Beck, M.D.

## 2014-10-09 NOTE — ED Notes (Signed)
PT UNABLE TO COMPLETE SWALLOWING SCREEN. PT WOULD NOT FOLLOW COMMANDS.  INTERPRETOR TO ASSIST.

## 2014-10-09 NOTE — BHH Counselor (Signed)
PSA attempted by CSW, interpreter present.  Patient responded w brief, minimal head nods to questions, nodded "yes" to being sad, "no" to being tired.  Remains in bed, minimally responsive, eyes open.  RN entered to assess and address patient incontinence and wet clothing/bedding.  CSW will return at more appropriate time to complete PSA if possible.  Santa Genera, LCSW Clinical Social Worker

## 2014-10-09 NOTE — ED Notes (Signed)
Language resources present to assist

## 2014-10-09 NOTE — ED Notes (Signed)
MD at bedside. admittiNg MD

## 2014-10-09 NOTE — ED Notes (Signed)
Per EMS, patient from [facility]. Patient was being bathed when she became unresponsive per nursing staff. Patient remained unresponsive for  minutes. Patient urinated and defecated on herself during this episode. Patient from [place] and was relocated here 9 months ago by family. Patient has repeatedly endorsed SI to family, and endorsed SI to staff at [facility] when coming around after episode. Patient arrived with sitter from [facility]. Patient is spanish speaking.

## 2014-10-09 NOTE — Progress Notes (Signed)
Received report from Morrie Sheldon, ED RN, Pt arrived unit accompanied by sitter and spanish interpreter. Pt is a poor historian, family not available at this time. MD notified of Pt's location. Will continue with current plan of care.

## 2014-10-09 NOTE — ED Notes (Signed)
Bed: RESA Expected date:  Expected time:  Means of arrival:  Comments: [facility] transfer, unresponsive, SI

## 2014-10-09 NOTE — ED Notes (Signed)
Unable to collect labs patient is not in room 

## 2014-10-10 ENCOUNTER — Inpatient Hospital Stay: Payer: Self-pay | Admitting: Family Medicine

## 2014-10-10 ENCOUNTER — Inpatient Hospital Stay (HOSPITAL_COMMUNITY): Payer: Federal, State, Local not specified - Other

## 2014-10-10 DIAGNOSIS — G9341 Metabolic encephalopathy: Secondary | ICD-10-CM

## 2014-10-10 DIAGNOSIS — N3000 Acute cystitis without hematuria: Secondary | ICD-10-CM

## 2014-10-10 DIAGNOSIS — F329 Major depressive disorder, single episode, unspecified: Secondary | ICD-10-CM

## 2014-10-10 LAB — COMPREHENSIVE METABOLIC PANEL
ALK PHOS: 81 U/L (ref 38–126)
ALT: 41 U/L (ref 14–54)
AST: 36 U/L (ref 15–41)
Albumin: 3.1 g/dL — ABNORMAL LOW (ref 3.5–5.0)
Anion gap: 6 (ref 5–15)
BUN: 11 mg/dL (ref 6–20)
CHLORIDE: 103 mmol/L (ref 101–111)
CO2: 27 mmol/L (ref 22–32)
CREATININE: 0.7 mg/dL (ref 0.44–1.00)
Calcium: 8.6 mg/dL — ABNORMAL LOW (ref 8.9–10.3)
GFR calc Af Amer: >60 mL/min (ref >60)
GFR calc non Af Amer: >60 mL/min (ref >60)
Glucose, Bld: 113 mg/dL — ABNORMAL HIGH (ref 65–99)
Potassium: 3.5 mmol/L (ref 3.5–5.1)
SODIUM: 136 mmol/L (ref 135–145)
Total Bilirubin: 0.7 mg/dL (ref 0.3–1.2)
Total Protein: 6.6 g/dL (ref 6.5–8.1)

## 2014-10-10 LAB — CBC
HCT: 40.1 % (ref 36.0–46.0)
Hemoglobin: 13.9 g/dL (ref 12.0–15.0)
MCH: 30 pg (ref 26.0–34.0)
MCHC: 34.7 g/dL (ref 30.0–36.0)
MCV: 86.4 fL (ref 78.0–100.0)
PLATELETS: 310 10*3/uL (ref 150–400)
RBC: 4.64 MIL/uL (ref 3.87–5.11)
RDW: 13 % (ref 11.5–15.5)
WBC: 8.1 10*3/uL (ref 4.0–10.5)

## 2014-10-10 LAB — TROPONIN I

## 2014-10-10 MED ORDER — KCL IN DEXTROSE-NACL 20-5-0.45 MEQ/L-%-% IV SOLN
INTRAVENOUS | Status: DC
  Administered 2014-10-10: 17:00:00 via INTRAVENOUS
  Filled 2014-10-10 (×2): qty 1000

## 2014-10-10 MED ORDER — CEFTRIAXONE SODIUM 1 G IJ SOLR
1.0000 g | INTRAMUSCULAR | Status: DC
  Administered 2014-10-10: 1 g via INTRAVENOUS
  Filled 2014-10-10 (×2): qty 10

## 2014-10-10 NOTE — Progress Notes (Signed)
   10/10/14 1340  What Happened  Was fall witnessed? Yes  Who witnessed fall? NT and Sitter  Patients activity before fall other (comment);bathroom-assisted  Point of contact buttocks  Was patient injured? No  Follow Up  MD notified (Dr. Cena Benton)  Time MD notified [phone]  Family notified Yes-comment (Left a message and grand- daughter in to visit.)  Time family notified 1350  Additional tests No  Progress note created (see row info) Yes  Adult Fall Risk Assessment  Risk Factor Category (scoring not indicated) High fall risk per protocol (document High fall risk)  Age 81  Fall History: Fall within 6 months prior to admission 0  Elimination; Bowel and/or Urine Incontinence 0  Elimination; Bowel and/or Urine Urgency/Frequency 0  Medications: includes PCA/Opiates, Anti-convulsants, Anti-hypertensives, Diuretics, Hypnotics, Laxatives, Sedatives, and Psychotropics 0  Patient Care Equipment 1  Mobility-Assistance 0  Mobility-Gait 0  Mobility-Sensory Deficit 0  Cognition-Awareness 0  Cognition-Impulsiveness 2  Cognition-Limitations 0 (spanish speaking)  Total Score 6  Patient's Fall Risk High Fall Risk (>13 points)  Adult Fall Risk Interventions  Required Bundle Interventions *See Row Information* Low fall risk - low requirements implemented  Additional Interventions 24 hour supervision/sitter  Fall with Injury Screening  Risk For Fall Injury- See Row Information  Nurse judgement  Intervention(s) for 2 or more risk criteria identified Low Bed  Vitals  Temp 98.4 F (36.9 C)  Temp Source Oral  BP (!) 153/68 mmHg  BP Location Right Arm  BP Method Automatic  Resp 18  Oxygen Therapy  SpO2 94 %  O2 Device Room Air  Pain Assessment  Pain Assessment PAINAD  PAINAD (Pain Assessment in Advanced Dementia)  Breathing 0  Negative Vocalization 0  Facial Expression 0  Body Language 0  Consolability 0  PAINAD Score 0  PCA/Epidural/Spinal Assessment  Respiratory Pattern Regular;Unlabored   Neurological  Neuro (WDL) WDL (spanish speaking.)  Level of Consciousness Alert  Orientation Level Oriented X4 (per interpreter)  Cognition Appropriate at baseline  Speech Clear  Musculoskeletal  Musculoskeletal (WDL) WDL  Integumentary  Integumentary (WDL) WDL

## 2014-10-10 NOTE — Progress Notes (Signed)
This Nurse was called to Pt's room, found nurse tech and assigned sitter in the room with pt on the floor. Nurse told that Pt was lowered to the floor to prevent fall/injury. No apparent injury at this time. Family called with no response. MD notified, Unit director and her assistant in to see this pt. will continue to monitor.

## 2014-10-10 NOTE — Progress Notes (Signed)
[facility] PROGRESS NOTE  Barbara Beck AVW:[id number] DOB: 1927/08/07 DOA: 10/09/2014 PCP: Lora Paula, MD  Assessment/Plan:  Principal Problem:   Syncope - Currently undergoing workup - No red flags on telemetry reported to me - Syncope could be secondary to decreased intravascular volume given history of poor oral intake as patient is refusing food and fluids. - Place on maintenance IV fluids  Active Problems:   GERD (gastroesophageal reflux disease)   Depression - Currently not controlled as this is contributing to decreased oral intake. Discussed this with patient. She still feels hopeless. - Defer to psychiatry    Metabolic encephalopathy - Could be secondary to dehydration and UTI. We'll continue - Patient was conversant with me and given improvement in mentation will advance diet - Urine drug screen negative hypertrophy results in chart    UTI (urinary tract infection) - Could be contributing to metabolic encephalopathy. - Urinalysis suspicious for UTI. - Place order for Rocephin while urine culture pending  Code Status: full Family Communication: no family at bedside Disposition Plan: Pending work up results   Consultants:  Psychiatry  Procedures:  none  Antibiotics:  Rocephin  HPI/Subjective: Pt has no new complaints. Nursing reports that patient fell but is not complaining of any pain and is currently at baseline  Objective: Filed Vitals:   10/10/14 1535  BP: 153/68  Pulse: 66  Temp: 98.4 F (36.9 C)  Resp: 16    Intake/Output Summary (Last 24 hours) at 10/10/14 1626 Last data filed at 10/10/14 1300  Gross per 24 hour  Intake      0 ml  Output    300 ml  Net   -300 ml   Filed Weights   10/09/14 1848  Weight: 53.4 kg (117 lb 11.6 oz)    Exam:   General:  Patient in no acute distress, alert and awake  Cardiovascular: Regular rate and rhythm, no rubs  Respiratory: No increased work of breathing, equal chest rise, no  wheezes  Abdomen: Soft, nondistended, nontender  Musculoskeletal: No cyanosis or clubbing.on limited Exam   Data Reviewed: Basic Metabolic Panel:  Recent Labs Lab 10/05/14 1650 10/09/14 1107 10/09/14 1353 10/09/14 2020 10/10/14 0200  NA 130* 133*  --   --  136  K 3.7 3.6  --   --  3.5  CL 94* 95*  --   --  103  CO2 27 28  --   --  27  GLUCOSE 143* 113*  --   --  113*  BUN 11 14  --   --  11  CREATININE 0.80 0.60  --  0.57 0.70  CALCIUM 9.0 9.0  --   --  8.6*  MG  --   --  1.9  --   --    Liver Function Tests:  Recent Labs Lab 10/05/14 1650 10/09/14 1107 10/10/14 0200  AST 28 62* 36  ALT 19 59* 41  ALKPHOS 92 89 81  BILITOT 0.6 0.6 0.7  PROT 7.0 7.4 6.6  ALBUMIN 3.4* 3.6 3.1*   No results for input(s): LIPASE, AMYLASE in the last 168 hours. No results for input(s): AMMONIA in the last 168 hours. CBC:  Recent Labs Lab 10/05/14 1650 10/09/14 1107 10/09/14 2020 10/10/14 0200  WBC 9.5 8.8 8.6 8.1  HGB 14.3 14.9 13.8 13.9  HCT 42.1 43.2 40.0 40.1  MCV 87.0 86.1 86.0 86.4  PLT 330 318 309 310   Cardiac Enzymes:  Recent Labs Lab 10/09/14 1353 10/09/14 2020 10/10/14  0200  TROPONINI <0.03 <0.03 <0.03   BNP (last 3 results)  Recent Labs  09/19/14 1950  BNP 30.1    ProBNP (last 3 results) No results for input(s): PROBNP in the last 8760 hours.  CBG:  Recent Labs Lab 10/09/14 1003 10/09/14 1120  GLUCAP 122* 102*    No results found for this or any previous visit (from the past 240 hour(s)).   Studies: Ct Head Wo Contrast  10/09/2014   CLINICAL DATA:  Acute onset of unresponsiveness. Excessive drooling with fall. Initial encounter.  EXAM: CT HEAD WITHOUT CONTRAST  TECHNIQUE: Contiguous axial images were obtained from the base of the skull through the vertex without intravenous contrast.  COMPARISON:  None.  FINDINGS: There is no evidence of acute intracranial hemorrhage, mass lesion, brain edema or extra-axial fluid collection. The ventricles  and subarachnoid spaces are appropriately sized for age. There is no CT evidence of acute cortical infarction. There is evidence of minimal chronic periventricular white matter disease. Intracranial vascular calcifications are noted.  The visualized paranasal sinuses, mastoid air cells and middle ears are clear. The calvarium is intact.  IMPRESSION: No acute intracranial findings or explanation for the patient's symptoms.   Electronically Signed   By: Carey Bullocks M.D.   On: 10/09/2014 11:59   Mr Brain Wo Contrast  10/09/2014   CLINICAL DATA:  Acutely unresponsive. Initial encounter. Patient fell.  EXAM: MRI HEAD WITHOUT CONTRAST  TECHNIQUE: Multiplanar, multiecho pulse sequences of the brain and surrounding structures were obtained without intravenous contrast.  COMPARISON:  Head CT earlier same day  FINDINGS: Diffusion imaging does not show any acute or subacute infarction. The brainstem is normal. No cerebellar insult. Within the cerebral hemispheres, there are mild chronic small-vessel ischemic changes within the deep white matter. No cortical or large vessel territory infarction. No mass lesion, hemorrhage, hydrocephalus or extra-axial collection. No pituitary mass. No inflammatory sinus disease. No skull or skullbase lesion.  IMPRESSION: No acute finding. Mild chronic small-vessel change of the cerebral hemispheric white matter.   Electronically Signed   By: Paulina Fusi M.D.   On: 10/09/2014 19:46   Portable Chest 1 View  10/09/2014   CLINICAL DATA:  Syncope, unresponsive for 5 minutes  EXAM: PORTABLE CHEST - 1 VIEW  COMPARISON:  10/06/2014  FINDINGS: Limited inspiratory effect. Heart size and vascular pattern are normal. Mild to moderate chronic appearing bronchitic change. No consolidation or effusion.  IMPRESSION: No active disease.   Electronically Signed   By: Esperanza Heir M.D.   On: 10/09/2014 13:55   Dg Abd 2 Views  10/09/2014   CLINICAL DATA:  81 year old female undergoing MRI clearance   EXAM: ABDOMEN - 2 VIEW  COMPARISON:  None.  FINDINGS: No abnormal metallic foreign body. The bowel gas pattern is normal. Degenerative disc disease noted at L4-L5 and L5-S1. No acute osseous lesion. No evidence of free air. Lung bases are clear.  IMPRESSION: No evidence of metallic foreign body.   Electronically Signed   By: Malachy Moan M.D.   On: 10/09/2014 16:51    Scheduled Meds: . enoxaparin (LOVENOX) injection  40 mg Subcutaneous Q24H   Continuous Infusions:   Time spent: 15 minutes    Penny Pia  [facility] Pager [phone]. If 7PM-7AM, please contact night-coverage at [email or web address], password [facility] 10/10/2014, 4:26 PM  LOS: 1 day

## 2014-10-10 NOTE — Progress Notes (Signed)
PHARMACY NOTE -  Ceftriaxone  Pharmacy has been assisting with dosing of ceftriaxone for UTI.  Dosage remains stable at 1g IV q24h and need for further dosage adjustment appears unlikely at present.  Will sign off at this time.  Please reconsult if a change in clinical status warrants re-evaluation of dosage.  Haynes Hoehn, PharmD, BCPS 10/10/2014, 4:53 PM  Pager: [phone]

## 2014-10-10 NOTE — Progress Notes (Signed)
  Echocardiogram 2D Echocardiogram has been performed.  Leta Jungling M 10/10/2014, 3:10 PM

## 2014-10-11 ENCOUNTER — Other Ambulatory Visit (HOSPITAL_COMMUNITY): Payer: Self-pay

## 2014-10-11 DIAGNOSIS — F332 Major depressive disorder, recurrent severe without psychotic features: Secondary | ICD-10-CM | POA: Diagnosis present

## 2014-10-11 DIAGNOSIS — R45851 Suicidal ideations: Secondary | ICD-10-CM

## 2014-10-11 DIAGNOSIS — E860 Dehydration: Secondary | ICD-10-CM

## 2014-10-11 DIAGNOSIS — B370 Candidal stomatitis: Secondary | ICD-10-CM | POA: Insufficient documentation

## 2014-10-11 DIAGNOSIS — N390 Urinary tract infection, site not specified: Secondary | ICD-10-CM

## 2014-10-11 LAB — URINE CULTURE

## 2014-10-11 MED ORDER — AMLODIPINE BESYLATE 10 MG PO TABS
10.0000 mg | ORAL_TABLET | Freq: Every day | ORAL | Status: DC
  Administered 2014-10-12 – 2014-10-16 (×5): 10 mg via ORAL
  Filled 2014-10-11 (×7): qty 1

## 2014-10-11 MED ORDER — FLUTICASONE PROPIONATE 50 MCG/ACT NA SUSP
2.0000 | Freq: Every day | NASAL | Status: DC
  Filled 2014-10-11 (×2): qty 16

## 2014-10-11 MED ORDER — FAMOTIDINE 10 MG PO TABS
10.0000 mg | ORAL_TABLET | Freq: Every day | ORAL | Status: DC
  Administered 2014-10-12 – 2014-10-16 (×5): 10 mg via ORAL
  Filled 2014-10-11 (×7): qty 1

## 2014-10-11 MED ORDER — SERTRALINE HCL 25 MG PO TABS
25.0000 mg | ORAL_TABLET | Freq: Every day | ORAL | Status: DC
  Administered 2014-10-11 – 2014-10-12 (×2): 25 mg via ORAL
  Filled 2014-10-11 (×3): qty 1

## 2014-10-11 MED ORDER — NYSTATIN 100000 UNIT/ML MT SUSP
5.0000 mL | Freq: Four times a day (QID) | OROMUCOSAL | Status: DC
  Administered 2014-10-11 – 2014-10-16 (×10): 500000 [IU] via ORAL
  Filled 2014-10-11 (×21): qty 5

## 2014-10-11 MED ORDER — CEFUROXIME AXETIL 500 MG PO TABS
500.0000 mg | ORAL_TABLET | Freq: Two times a day (BID) | ORAL | Status: DC
  Administered 2014-10-11 – 2014-10-15 (×6): 500 mg via ORAL
  Filled 2014-10-11 (×9): qty 1

## 2014-10-11 MED ORDER — SODIUM CHLORIDE 0.9 % IV SOLN
INTRAVENOUS | Status: DC
  Administered 2014-10-13 – 2014-10-14 (×2): via INTRAVENOUS

## 2014-10-11 NOTE — Progress Notes (Signed)
Pt recommended for inpatient geri psych and actively referring patient.   Olga Coaster, LCSW  Clinical Social Work  [employer] [phone]

## 2014-10-11 NOTE — Care Management Note (Signed)
Case Management Note  Patient Details  Name: Barbara Beck MRN: [id number] Date of Birth: 10-26-1927  Subjective/Objective: 81 y/o f spanish speaking admitted w/syncope.SI,1:1 Psych cons.CSW following.                   Action/Plan:d/c plan IP Psych facility.   Expected Discharge Date:   (UNKNOWN)               Expected Discharge Plan:  Psychiatric Hospital  In-House Referral:  Clinical Social Work  Discharge planning Services  CM Consult  Post Acute Care Choice:    Choice offered to:     DME Arranged:    DME Agency:     HH Arranged:    HH Agency:     Status of Service:  In process, will continue to follow  Medicare Important Message Given:    Date Medicare IM Given:    Medicare IM give by:    Date Additional Medicare IM Given:    Additional Medicare Important Message give by:     If discussed at Long Length of Stay Meetings, dates discussed:    Additional Comments:  Lanier Clam, RN 10/11/2014, 11:32 AM

## 2014-10-11 NOTE — Clinical Social Work Psych Assess (Signed)
Clinical Social Work Programme researcher, broadcasting/film/video Social Worker:  Antony Salmon Date/Time:  10/11/2014, 3:46 PM Referred By:  Physician Date Referred:  10/10/14 Reason for Referral:  Behavioral Health Issues   Presenting Symptoms/Problems  Presenting Symptoms/Problems(in person's/family's own words):  Patient admitted from [facility] after syncope event. Pt not eating, speaking, or getting up. Patient has suicidal thoughts of not wanting to live anymore. Pt recommended for inpatient geri psych treatment by psychiatrist.    Abuse/Neglect/Trauma History  Abuse/Neglect/Trauma History:    Abuse/Neglect/Trauma History Comments (indicate dates):     Psychiatric History  Psychiatric History:  Inpatient/Hospitalization Psychiatric Medication:     Current Mental Health Hospitalizations/Previous Mental Health History:     Current Provider:   Place and Date:    Current Medications:     Previous Inpatient Admission/Date/Reason:     Emotional Health/Current Symptoms  Suicide/Self Harm: Suicide Attempt in the Past (date/description), Suicidal Ideation (ex. "I can't take anymore, I wish I could disappear") Suicide Attempt in Past (date/description):    Other Harmful Behavior (ex. homicidal ideation) (describe):     Psychotic/Dissociative Symptoms  Psychotic/Dissociative Symptoms: Catatonic Behavior, Inability to Care for Self Other Psychotic/Dissociative Symptoms:    Attention/Behavioral Symptoms  Attention/Behavioral Symptoms: Unable to Accurately Assess Other Attention/Behavioral Symptoms:     Cognitive Impairment  Cognitive Impairment:  Unable to Accurately Assess Other Cognitive Impairment:     Mood and Adjustment  Mood and Adjustment:  Unable to Accurately Assess   Stress, Anxiety, Trauma, Any Recent Loss/Stressor  Stress, Anxiety, Trauma, Any Recent Loss/Stressor: None Reported Anxiety (frequency):    Phobia (specify):    Compulsive  Behavior (specify):    Obsessive Behavior (specify):    Other Stress, Anxiety, Trauma, Any Recent Loss/Stressor:     Substance Abuse/Use  Substance Abuse/Use: None SBIRT Completed (please refer for detailed history): No Self-reported Substance Use (last use and frequency):    Urinary Drug Screen Completed: No Alcohol Level:    Environment/Housing/Living Arrangement  Environmental/Housing/Living Arrangement: With Family Member, With Other Relatives Who is in the Home:    Emergency Contact:     Financial  Financial:     Patient's Strengths and Goals  Patient's Strengths and Goals (patient's own words):  Patient strengths include a strong family support. Pt family have tried to support patient and gotten involved in a senior center however pt has not had any other strong support.   Clinical Social Worker's Interpretive Summary  Clinical Social Workers Interpretive Summary:  Pt recommended for inpatient geri psych. Pt has severe major depression and not engaging with her environemnt, not eating, and remains suicidal. Per chart review pt family is not familiar iwht her previous psych history.  Disposition  Disposition: Inpatient Referral Made [facility], [facility], [place])

## 2014-10-11 NOTE — Consult Note (Signed)
[facility] Consult   Reason for Consult:  Depression and suicide ideation Referring Physician:  Dr. Dyann Kief Patient Identification: Barbara Beck MRN:  [id number] Principal Diagnosis: MDD (major depressive disorder), recurrent severe, without psychosis Diagnosis:   Patient Active Problem List   Diagnosis Date Noted  . MDD (major depressive disorder), recurrent severe, without psychosis [F33.2] 10/11/2014  . Syncope and collapse [R55] 10/09/2014  . Syncope [R55] 10/09/2014  . Metabolic encephalopathy [W09.81] 10/09/2014  . UTI (urinary tract infection) [N39.0] 10/09/2014  . 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 Depression [F32.9] 10/07/2014  . Poor dentition [K08.8] 08/01/2014  . Decreased visual acuity [H54.7] 08/01/2014  . Sinus bradycardia [R00.1] 04/12/2014  . Chronic diarrhea [K52.9] 03/19/2014  . Bradycardia [R00.1] 02/19/2014  . Cataract [H26.9] 02/19/2014  . Bartholin gland cyst [N75.0] 02/19/2014  . Right sided abdominal pain [R10.9] 01/29/2014  . Allergic rhinoconjunctivitis of both eyes [J30.9, H10.13] 01/29/2014  . Vitamin D deficiency [E55.9] 01/29/2014  . SOB (shortness of breath) [R06.02] 01/29/2014  . Tingling [R20.2] 01/29/2014  . Spanish speaking patient [R47.89] 01/29/2014  . Osteoarthritis of left hip [M16.12] 01/29/2014  . DJD (degenerative joint disease), lumbar [M47.816] 01/29/2014  . Umbilical hernia [X91.4] 01/29/2014  . Hypertension [I10]   . GERD (gastroesophageal reflux disease) [K21.9]     Total Time spent with patient: 30 minutes  Subjective:   Barbara Beck is a 81 y.o. female patient admitted with depression, psychomotor retardation and suicidal ideation.  HPI:  Barbara Beck  Is a 81 year old female seen, chart reviewed for psychiatric consultation and evaluation of increased symptoms of depression, psychomotor retardation, suicidal ideation, decreased personal care. Patient was  initially admitted to [facility] but she was transferred to the [facility] secondary to syncopal episode. Case discussed with the administrative coordinator at [facility] who reported patient need to be referred to geriatric psychiatry because they may not have any appropriate bed for her. Patient was not able to verbally contribute for this evaluation. Patient appeared lying in her bed with increased symptoms of depression especially withdrawn, isolated, decreased speech and oral intake.   Please review the following information for further details. Patient is known to the hospital with multiple visits to the ER over the last several months for several reasons most recently last 2 visits related to depression, suicidal ideation, patient recently moved to [facility] in the last night and wants to stay with her family. Patient has not been able to adapt to her surroundings. She was to senior center during the day when her kids are at work. She has been more depressed and has lost interest in her activities of daily living. She has a poor appetite and has lost 50 pounds since January 2016. She has crying episodes at home but denies any delusions or hallucinations. Patient was admitted to behavioral health on 9/20, she refused her morning medications today, apparently became unresponsive in the bathroom but shallow breathing. Blood pressure at be a change was 140/100. Tachycardic in the 140s. Pulse ox 98% She also became hypoxic and was transferred to the ER for further evaluation.Pt noted to have excessive drooling, right sided mouth drooping after spitting out bottom dentures.Pt have been refusing to eat, take meds, drink fluids , unarousable in the ER at the time of my exam   HPI Elements:   Location:  Severe depression. Quality:  Fair to poor. Severity:  Chronic. Timing:  Unknown. Duration:  About 3-6 months. Context:  Multiple psychosocial, medical  problems.  Past Medical History:  Past Medical History  Diagnosis Date  . Hypertension ?   Marland Kitchen GERD (gastroesophageal reflux disease) ?   Marland Kitchen Suicidal ideation     Past Surgical History  Procedure Laterality Date  . Eye surgery Left     Cataract Surgery    Family History:  Family History  Problem Relation Age of Onset  . Cancer Daughter     breast with bone mets   . Hypertension Father    Social History:  History  Alcohol Use No     History  Drug Use No    Social History   Social History  . Marital Status: Widowed    Spouse Name: N/A  . Number of Children: 5  . Years of Education: 0   Occupational History  . Retired     Social History Main Topics  . Smoking status: Former Smoker -- 2.00 packs/day for 13 years    Quit date: 01/19/1984  . Smokeless tobacco: Never Used  . Alcohol Use: No  . Drug Use: No  . Sexual Activity: No   Other Topics Concern  . None   Social History Narrative   From [place]   Moved to [place] in 2014, back to [place], back to [place] 01/18/2014.    Did not attend school.    Has 4 living children. Had 5 total, one died at 68 months old.    Lives with son and daughter-in-law in [place].   Has a daughter, Conception Oms, who is in the [place].    Additional Social History:                          Allergies:   Allergies  Allergen Reactions  . Aspirin Hives and Nausea Only    NOTHING THAT CONTAINS ASPIRIN    Labs:  Results for orders placed or performed during the hospital encounter of 10/09/14 (from the past 48 hour(s))  Urine culture     Status: None (Preliminary result)   Collection Time: 10/09/14  7:46 PM  Result Value Ref Range   Specimen Description URINE, RANDOM    Special Requests NONE    Culture      TOO YOUNG TO READ Performed at [facility]    Report Status PENDING   CBC     Status: None   Collection Time: 10/09/14  8:20 PM  Result Value Ref Range   WBC 8.6 4.0 - 10.5 K/uL   RBC 4.65 3.87 - 5.11 MIL/uL    Hemoglobin 13.8 12.0 - 15.0 g/dL   HCT 40.0 36.0 - 46.0 %   MCV 86.0 78.0 - 100.0 fL   MCH 29.7 26.0 - 34.0 pg   MCHC 34.5 30.0 - 36.0 g/dL   RDW 12.9 11.5 - 15.5 %   Platelets 309 150 - 400 K/uL  Creatinine, serum     Status: None   Collection Time: 10/09/14  8:20 PM  Result Value Ref Range   Creatinine, Ser 0.57 0.44 - 1.00 mg/dL   GFR calc non Af Amer >60 >60 mL/min   GFR calc Af Amer >60 >60 mL/min    Comment: (NOTE) The eGFR has been calculated using the CKD EPI equation. This calculation has not been validated in all clinical situations. eGFR's persistently <60 mL/min signify possible Chronic Kidney Disease.   Troponin I     Status: None   Collection Time: 10/09/14  8:20 PM  Result Value Ref Range   Troponin I <0.03 <0.031 ng/mL    Comment:        NO INDICATION OF MYOCARDIAL INJURY.   Troponin I     Status: None   Collection Time: 10/10/14  2:00 AM  Result Value Ref Range   Troponin I <0.03 <0.031 ng/mL    Comment:        NO INDICATION OF MYOCARDIAL INJURY.   Comprehensive metabolic panel     Status: Abnormal   Collection Time: 10/10/14  2:00 AM  Result Value Ref Range   Sodium 136 135 - 145 mmol/L   Potassium 3.5 3.5 - 5.1 mmol/L   Chloride 103 101 - 111 mmol/L   CO2 27 22 - 32 mmol/L   Glucose, Bld 113 (H) 65 - 99 mg/dL   BUN 11 6 - 20 mg/dL   Creatinine, Ser 0.70 0.44 - 1.00 mg/dL   Calcium 8.6 (L) 8.9 - 10.3 mg/dL   Total Protein 6.6 6.5 - 8.1 g/dL   Albumin 3.1 (L) 3.5 - 5.0 g/dL   AST 36 15 - 41 U/L   ALT 41 14 - 54 U/L   Alkaline Phosphatase 81 38 - 126 U/L   Total Bilirubin 0.7 0.3 - 1.2 mg/dL   GFR calc non Af Amer >60 >60 mL/min   GFR calc Af Amer >60 >60 mL/min    Comment: (NOTE) The eGFR has been calculated using the CKD EPI equation. This calculation has not been validated in all clinical situations. eGFR's persistently <60 mL/min signify possible Chronic Kidney Disease.    Anion gap 6 5 - 15  CBC     Status: None   Collection Time:  10/10/14  2:00 AM  Result Value Ref Range   WBC 8.1 4.0 - 10.5 K/uL   RBC 4.64 3.87 - 5.11 MIL/uL   Hemoglobin 13.9 12.0 - 15.0 g/dL   HCT 40.1 36.0 - 46.0 %   MCV 86.4 78.0 - 100.0 fL   MCH 30.0 26.0 - 34.0 pg   MCHC 34.7 30.0 - 36.0 g/dL   RDW 13.0 11.5 - 15.5 %   Platelets 310 150 - 400 K/uL    Vitals: Blood pressure 155/115, pulse 70, temperature 98.4 F (36.9 C), temperature source Axillary, resp. rate 20, height _0  (1.6 m), weight 53.4 kg (117 lb 11.6 oz), SpO2 95 %.  Risk to Self: Is patient at risk for suicide?: No Risk to Others:   Prior Inpatient Therapy:   Prior Outpatient Therapy:    Current Facility-Administered Medications  Medication Dose Route Frequency Provider Last Rate Last Dose  . amLODipine (NORVASC) tablet 10 mg  10 mg Oral Daily Reyne Dumas, MD   10 mg at 10/11/14 0946  . cefTRIAXone (ROCEPHIN) 1 g in dextrose 5 % 50 mL IVPB  1 g Intravenous Q24H Velvet Bathe, MD   1 g at 10/10/14 1808  . dextrose 5 % and 0.45 % NaCl with KCl 20 mEq/L infusion   Intravenous Continuous Velvet Bathe, MD 50 mL/hr at 10/11/14 0950    . enoxaparin (LOVENOX) injection 40 mg  40 mg Subcutaneous Q24H Reyne Dumas, MD   40 mg at 10/10/14 2009  . famotidine (PEPCID) tablet 10 mg  10 mg Oral Daily Reyne Dumas, MD   10 mg at 10/11/14 0946  . fluticasone (FLONASE) 50 MCG/ACT nasal spray 2 spray  2 spray Each Nare Daily Reyne Dumas, MD   Stopped at 10/11/14 0300    Musculoskeletal: Strength &  Muscle Tone: decreased Gait & Station: unable to stand Patient leans: N/A  Psychiatric Specialty Exam: Physical Exam as per history and physical   ROS unable to complete due to current mental state   Blood pressure 155/115, pulse 70, temperature 98.4 F (36.9 C), temperature source Axillary, resp. rate 20, height _0  (1.6 m), weight 53.4 kg (117 lb 11.6 oz), SpO2 95 %.Body mass index is 20.86 kg/(m^2).  General Appearance: Disheveled and Guarded  Eye Contact::  Minimal  Speech:   Blocked  Volume:  Not responded verbally  Mood:  Depressed and Worthless  Affect:  Flat  Thought Process:  NA  Orientation:  NA  Thought Content:  NA  Suicidal Thoughts:  Yes.  without intent/plan  Homicidal Thoughts:  No  Memory:  NA  Judgement:  NA  Insight:  NA  Psychomotor Activity:  Psychomotor Retardation  Concentration:  NA  Recall:  NA  Fund of Knowledge:NA  Language: NA  Akathisia:  Negative  Handed:  Right  AIMS (if indicated):     Assets:  Others:  Unable to assess at this time  ADL's:  Impaired  Cognition: Impaired,  Moderate  Sleep:      Medical Decision Making: Review of Psycho-Social Stressors (1), Review or order clinical lab tests (1), Established Problem, Worsening (2), Review of Last Therapy Session (1), Review or order medicine tests (1), Review of Medication Regimen & Side Effects (2) and Review of New Medication or Change in Dosage (2)  Treatment Plan Summary: Daily contact with patient to assess and evaluate symptoms and progress in treatment and Medication management  Plan:  Continue safety sitter as patient cannot provide contract for safety Continue current medication without changes Recommend psychiatric Inpatient admission when medically cleared. Supportive therapy provided about ongoing stressors.  Disposition: Refer to the psychiatric social service for appropriate geriatric psychiatry placement.  JONNALAGADDA,JANARDHAHA R. 10/11/2014 12:01 PM

## 2014-10-11 NOTE — Progress Notes (Signed)
Patient was uncooperative with EEG. Dr. Gwenlyn Perking notified. Pt continues to refuse to open eyes or speak. Does not follow commands. Refused all meals and drink.

## 2014-10-11 NOTE — Progress Notes (Signed)
Utilization review completed.  L. J. Raysha Tilmon RN, BSN, CM 

## 2014-10-11 NOTE — Progress Notes (Signed)
Pt uncooperative. Tech attempted to hook up for EEG, pt shook her head vigorously and got worse as time went on. Nurse tech tried to assist with no success.

## 2014-10-12 DIAGNOSIS — K219 Gastro-esophageal reflux disease without esophagitis: Secondary | ICD-10-CM

## 2014-10-12 DIAGNOSIS — I10 Essential (primary) hypertension: Secondary | ICD-10-CM

## 2014-10-12 NOTE — Progress Notes (Signed)
This shift pt more interactive, sitting up eating, taking meds and communicating with family members. Denies pain and SI intent. Safety sitter in room. Will continue to monitor

## 2014-10-12 NOTE — Progress Notes (Signed)
[facility] PROGRESS NOTE  Ventura Leggitt ZOX:[id number] DOB: Aug 15, 1927 DOA: 10/09/2014 PCP: Lora Paula, MD  Assessment/Plan: Syncope -overall work neg, except for poor PO intake with ongoing depression -will follow PT rec's -Place on maintenance IV fluids -will resume use of maintenance IVF'  GERD (gastroesophageal reflux disease): continue PPI    Depression -Currently not controlled as this is contributing to decreased oral intake.  -She still feels hopeless and with depressed affect  -Defer to psychiatry for further therapy and rec's; Psych recommending geriatric psych facility, but patient w/o insurance and family no looking for facilities that are far away  -will continue low dose sertraline                                                                                       Metabolic encephalopathy - Could be secondary to dehydration and UTI.  -patient feeling better and while talking to her in spanish was appropriate  UTI (urinary tract infection) - Could be contributing to metabolic encephalopathy. - Urinalysis suspicious for UTI. - will follow cx data and continue empiric abx's; but will use PO regimen  Thrush -will treat with nystatin   HTN -will continue norvasc   Code Status: full Family Communication: no family at bedside Disposition Plan: Pending work up results   Consultants:  Psychiatry  Procedures:  EEG attempted  Antibiotics:  Rocephin 9/21>>9/23  ceftin 9/23  HPI/Subjective: Pt has no new complaints.more conversant, denies SI. Still no eating much. No fever, no CP. Flat affect.  Objective: Filed Vitals:   10/12/14 1440  BP: 139/57  Pulse: 80  Temp: 98.4 F (36.9 C)  Resp: 18    Intake/Output Summary (Last 24 hours) at 10/12/14 1853 Last data filed at 10/12/14 1711  Gross per 24 hour  Intake   1200 ml  Output      0 ml  Net   1200 ml   Filed Weights   10/09/14 1848  Weight: 53.4 kg (117 lb 11.6 oz)     Exam:   General:  Patient in no acute distress, alert and awake X 2; able to follow commands, spanish speaking only. Denies CP and SOB. More conversant today   Cardiovascular: Regular rate and rhythm, no rubs  Respiratory: No increased work of breathing, equal chest rise, no wheezes  Abdomen: Soft, nondistended, nontender  Musculoskeletal: No cyanosis or clubbing  Data Reviewed: Basic Metabolic Panel:  Recent Labs Lab 10/09/14 1107 10/09/14 1353 10/09/14 2020 10/10/14 0200  NA 133*  --   --  136  K 3.6  --   --  3.5  CL 95*  --   --  103  CO2 28  --   --  27  GLUCOSE 113*  --   --  113*  BUN 14  --   --  11  CREATININE 0.60  --  0.57 0.70  CALCIUM 9.0  --   --  8.6*  MG  --  1.9  --   --    Liver Function Tests:  Recent Labs Lab 10/09/14 1107 10/10/14 0200  AST 62* 36  ALT 59* 41  ALKPHOS 89 81  BILITOT  0.6 0.7  PROT 7.4 6.6  ALBUMIN 3.6 3.1*   CBC:  Recent Labs Lab 10/09/14 1107 10/09/14 2020 10/10/14 0200  WBC 8.8 8.6 8.1  HGB 14.9 13.8 13.9  HCT 43.2 40.0 40.1  MCV 86.1 86.0 86.4  PLT 318 309 310   Cardiac Enzymes:  Recent Labs Lab 10/09/14 1353 10/09/14 2020 10/10/14 0200  TROPONINI <0.03 <0.03 <0.03   BNP (last 3 results)  Recent Labs  09/19/14 1950  BNP 30.1    CBG:  Recent Labs Lab 10/09/14 1003 10/09/14 1120  GLUCAP 122* 102*    Recent Results (from the past 240 hour(s))  Urine culture     Status: None   Collection Time: 10/09/14  7:46 PM  Result Value Ref Range Status   Specimen Description URINE, RANDOM  Final   Special Requests NONE  Final   Culture   Final    MULTIPLE SPECIES PRESENT, SUGGEST RECOLLECTION Performed at [facility]    Report Status 10/11/2014 FINAL  Final     Studies: No results found.  Scheduled Meds: . amLODipine  10 mg Oral Daily  . cefUROXime  500 mg Oral BID WC  . enoxaparin (LOVENOX) injection  40 mg Subcutaneous Q24H  . famotidine  10 mg Oral Daily  . fluticasone  2  spray Each Nare Daily  . nystatin  5 mL Oral QID  . sertraline  25 mg Oral QHS   Continuous Infusions: . sodium chloride      Time spent: 30 minutes    Vassie Loll  [facility] Pager [phone]. If 7PM-7AM, please contact night-coverage at [email or web address], password [facility] 10/12/2014, 6:53 PM  LOS: 3 days

## 2014-10-12 NOTE — Progress Notes (Signed)
[facility] PROGRESS NOTE  Barbara Beck EAV:[id number] DOB: 1927-05-26 DOA: 10/09/2014 PCP: Lora Paula, MD  Assessment/Plan:  Principal Problem:   Syncope - overall work neg, except for poor PO intake with ongoing depression - .will follow PT rec's - Place on maintenance IV fluids -will resume use of maintenance IVF'  GERD (gastroesophageal reflux disease): continue PPI    Depression - Currently not controlled as this is contributing to decreased oral intake. Discussed this with patient. She still feels hopeless. - Defer to psychiatry -recommending geriatric psych facility -will continue low dose sertralin                                                                                       Metabolic encephalopathy - Could be secondary to dehydration and UTI.  -patient feeling better and while talking to her in spanish was appropriate    UTI (urinary tract infection) - Could be contributing to metabolic encephalopathy. - Urinalysis suspicious for UTI. - will follow cx data and continue empiric abx's  Code Status: full Family Communication: no family at bedside Disposition Plan: Pending work up results   Consultants:  Psychiatry  Procedures:  EEG attempted  Antibiotics:  Rocephin 9/21>>9/23  ceftin 9/23  HPI/Subjective: Pt has no new complaints. Nursing described lack of cooperation, telemetry removal and also an episode of seizure like activity  Objective: Filed Vitals:   10/11/14 0956  BP: 155/115  Pulse: 70  Temp: 98.4 F (36.9 C)  Resp: 20    Intake/Output Summary (Last 24 hours) at 10/12/14 0017 Last data filed at 10/11/14 1619  Gross per 24 hour  Intake  902.5 ml  Output    250 ml  Net  652.5 ml   Filed Weights   10/09/14 1848  Weight: 53.4 kg (117 lb 11.6 oz)    Exam:   General:  Patient in no acute distress, alert and awake X2; able to follow commands, spanish speaking. Reports she is tired, and w/o appetite    Cardiovascular: Regular rate and rhythm, no rubs  Respiratory: No increased work of breathing, equal chest rise, no wheezes  Abdomen: Soft, nondistended, nontender  Musculoskeletal: No cyanosis or clubbing.on limited Exam   Data Reviewed: Basic Metabolic Panel:  Recent Labs Lab 10/05/14 1650 10/09/14 1107 10/09/14 1353 10/09/14 2020 10/10/14 0200  NA 130* 133*  --   --  136  K 3.7 3.6  --   --  3.5  CL 94* 95*  --   --  103  CO2 27 28  --   --  27  GLUCOSE 143* 113*  --   --  113*  BUN 11 14  --   --  11  CREATININE 0.80 0.60  --  0.57 0.70  CALCIUM 9.0 9.0  --   --  8.6*  MG  --   --  1.9  --   --    Liver Function Tests:  Recent Labs Lab 10/05/14 1650 10/09/14 1107 10/10/14 0200  AST 28 62* 36  ALT 19 59* 41  ALKPHOS 92 89 81  BILITOT 0.6 0.6 0.7  PROT 7.0 7.4 6.6  ALBUMIN 3.4* 3.6  3.1*   CBC:  Recent Labs Lab 10/05/14 1650 10/09/14 1107 10/09/14 2020 10/10/14 0200  WBC 9.5 8.8 8.6 8.1  HGB 14.3 14.9 13.8 13.9  HCT 42.1 43.2 40.0 40.1  MCV 87.0 86.1 86.0 86.4  PLT 330 318 309 310   Cardiac Enzymes:  Recent Labs Lab 10/09/14 1353 10/09/14 2020 10/10/14 0200  TROPONINI <0.03 <0.03 <0.03   BNP (last 3 results)  Recent Labs  09/19/14 1950  BNP 30.1    CBG:  Recent Labs Lab 10/09/14 1003 10/09/14 1120  GLUCAP 122* 102*    Recent Results (from the past 240 hour(s))  Urine culture     Status: None   Collection Time: 10/09/14  7:46 PM  Result Value Ref Range Status   Specimen Description URINE, RANDOM  Final   Special Requests NONE  Final   Culture   Final    MULTIPLE SPECIES PRESENT, SUGGEST RECOLLECTION Performed at [facility]    Report Status 10/11/2014 FINAL  Final     Studies: No results found.  Scheduled Meds: . amLODipine  10 mg Oral Daily  . cefUROXime  500 mg Oral BID WC  . enoxaparin (LOVENOX) injection  40 mg Subcutaneous Q24H  . famotidine  10 mg Oral Daily  . fluticasone  2 spray Each Nare Daily   . nystatin  5 mL Oral QID  . sertraline  25 mg Oral QHS   Continuous Infusions: . sodium chloride      Time spent: 30 minutes    Vassie Loll  [facility] Pager [phone]. If 7PM-7AM, please contact night-coverage at [email or web address], password [facility] 10/12/2014, 12:17 AM  LOS: 3 days

## 2014-10-13 LAB — BASIC METABOLIC PANEL
ANION GAP: 7 (ref 5–15)
BUN: 10 mg/dL (ref 6–20)
CHLORIDE: 101 mmol/L (ref 101–111)
CO2: 27 mmol/L (ref 22–32)
Calcium: 8.3 mg/dL — ABNORMAL LOW (ref 8.9–10.3)
Creatinine, Ser: 0.69 mg/dL (ref 0.44–1.00)
GFR calc Af Amer: >60 mL/min (ref >60)
GLUCOSE: 171 mg/dL — AB (ref 65–99)
POTASSIUM: 3.4 mmol/L — AB (ref 3.5–5.1)
Sodium: 135 mmol/L (ref 135–145)

## 2014-10-13 MED ORDER — SERTRALINE HCL 25 MG PO TABS
25.0000 mg | ORAL_TABLET | Freq: Every day | ORAL | Status: DC
  Administered 2014-10-15 – 2014-10-16 (×2): 25 mg via ORAL
  Filled 2014-10-13 (×3): qty 1

## 2014-10-13 NOTE — Consult Note (Signed)
[facility] Face-to-Face Psychiatry Consult   Reason for Consult:  Suicidal ideations and depression Referring Physician:  Dr. Gwenlyn Perking Patient Identification: Barbara Beck MRN:  [id number] Principal Diagnosis: MDD (major depressive disorder), recurrent severe, without psychosis Diagnosis:   Patient Active Problem List   Diagnosis Date Noted  . MDD (major depressive disorder), recurrent severe, without psychosis [F33.2] 10/11/2014  . Dehydration [E86.0]   . Thrush [B37.0]   . Syncope and collapse [R55] 10/09/2014  . Syncope [R55] 10/09/2014  . Metabolic encephalopathy [G93.41] 10/09/2014  . UTI (urinary tract infection) [N39.0] 10/09/2014  . 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 Depression [F32.9] 10/07/2014  . Poor dentition [K08.8] 08/01/2014  . Decreased visual acuity [H54.7] 08/01/2014  . Sinus bradycardia [R00.1] 04/12/2014  . Chronic diarrhea [K52.9] 03/19/2014  . Bradycardia [R00.1] 02/19/2014  . Cataract [H26.9] 02/19/2014  . Bartholin gland cyst [N75.0] 02/19/2014  . Right sided abdominal pain [R10.9] 01/29/2014  . Allergic rhinoconjunctivitis of both eyes [J30.9, H10.13] 01/29/2014  . Vitamin D deficiency [E55.9] 01/29/2014  . SOB (shortness of breath) [R06.02] 01/29/2014  . Tingling [R20.2] 01/29/2014  . Spanish speaking patient [R47.89] 01/29/2014  . Osteoarthritis of left hip [M16.12] 01/29/2014  . DJD (degenerative joint disease), lumbar [M47.816] 01/29/2014  . Umbilical hernia [K42.9] 01/29/2014  . Hypertension [I10]   . GERD (gastroesophageal reflux disease) [K21.9]     Total Time spent with patient: 1 hour  Subjective:   Barbara Beck is a 81 y.o. female patient admitted with syncope.  HPI: 81 Y/O Hispanic female who does not speak [place] and a poor historian. Patient apparently moved from [place] to [place] to live with her family about 9 months ago. She was admitted to [facility] few days ago but was  transferred to the medical floor due to syncope and refusing to eat. I spoke to Dr. Gwenlyn Perking who states that patient has been worked up and has been unable to find the cause for syncope. However, patient was found to be dehydrated, she has been rehydrated and now eating and taking medication partially. Patient reports that language barrier has made it difficult for her to transition into life in [place]. She reports ongoing suicidal thoughts, worsening depression, feels hopeless, lost of  Interest, lack of motivation, poor self esteem and low energy level. is very low. She is tired she wants to give up. She states she had an episode of depression years ago but she endured it. Did not pursue treatment. Patient reports being stressed out and overwhelm. She denies psychosis, delusional thinking, drugs and alcohol abuse.  HPI Elements:   Location:  depression. Quality:  severe.  Past Medical History:  Past Medical History  Diagnosis Date  . Hypertension ?   Marland Kitchen GERD (gastroesophageal reflux disease) ?   Marland Kitchen Suicidal ideation     Past Surgical History  Procedure Laterality Date  . Eye surgery Left     Cataract Surgery    Family History:  Family History  Problem Relation Age of Onset  . Cancer Daughter     breast with bone mets   . Hypertension Father    Social History:  History  Alcohol Use No     History  Drug Use No    Social History   Social History  . Marital Status: Widowed    Spouse Name: N/A  . Number of Children: 5  . Years of Education: 0   Occupational History  . Retired     Social History Main  Topics  . Smoking status: Former Smoker -- 2.00 packs/day for 13 years    Quit date: 01/19/1984  . Smokeless tobacco: Never Used  . Alcohol Use: No  . Drug Use: No  . Sexual Activity: No   Other Topics Concern  . None   Social History Narrative   From [place].   Moved to [place] in 2014, back to [place], back to [place] 01/18/2014.    Did not attend school.    Has 4 living  children. Had 5 total, one died at 26 months old.    Lives with son and daughter-in-law in [place].   Has a daughter, Barbara Beck, who is in the [place].    Additional Social History:                          Allergies:   Allergies  Allergen Reactions  . Aspirin Hives and Nausea Only    NOTHING THAT CONTAINS ASPIRIN    Labs: No results found for this or any previous visit (from the past 48 hour(s)).  Vitals: Blood pressure 151/70, pulse 68, temperature 98.5 F (36.9 C), temperature source Axillary, resp. rate 20, height  (1.6 m), weight 53.4 kg (117 lb 11.6 oz), SpO2 98 %.  Risk to Self: Is patient at risk for suicide?: No Risk to Others:   Prior Inpatient Therapy:   Prior Outpatient Therapy:    Current Facility-Administered Medications  Medication Dose Route Frequency Jerron Niblack Last Rate Last Dose  . 0.9 %  sodium chloride infusion   Intravenous Continuous Vassie Loll, MD      . amLODipine (NORVASC) tablet 10 mg  10 mg Oral Daily Richarda Overlie, MD   10 mg at 10/12/14 1147  . cefUROXime (CEFTIN) tablet 500 mg  500 mg Oral BID WC Vassie Loll, MD   500 mg at 10/12/14 1830  . enoxaparin (LOVENOX) injection 40 mg  40 mg Subcutaneous Q24H Richarda Overlie, MD   40 mg at 10/12/14 2052  . famotidine (PEPCID) tablet 10 mg  10 mg Oral Daily Richarda Overlie, MD   10 mg at 10/12/14 1147  . fluticasone (FLONASE) 50 MCG/ACT nasal spray 2 spray  2 spray Each Nare Daily Richarda Overlie, MD   Stopped at 10/11/14 0949  . nystatin (MYCOSTATIN) 100000 UNIT/ML suspension 500,000 Units  5 mL Oral QID Vassie Loll, MD   500,000 Units at 10/12/14 1148  . sertraline (ZOLOFT) tablet 25 mg  25 mg Oral QHS Vassie Loll, MD   25 mg at 10/12/14 2052    Musculoskeletal: Strength & Muscle Tone: decreased Gait & Station: not tested Patient leans: N/A  Psychiatric Specialty Exam: Physical Exam  Psychiatric: Judgment normal. Her speech is delayed. She is slowed and withdrawn. Cognition and memory are  normal. She exhibits a depressed mood. She expresses suicidal ideation.    Review of Systems  Constitutional: Positive for malaise/fatigue.  HENT: Negative.   Eyes: Negative.   Respiratory: Negative.   Cardiovascular: Negative.   Gastrointestinal: Negative.   Genitourinary: Negative.   Musculoskeletal: Positive for myalgias.  Skin: Negative.   Neurological: Negative.   Endo/Heme/Allergies: Negative.   Psychiatric/Behavioral: Positive for depression, suicidal ideas and memory loss. The patient has insomnia.     Blood pressure 151/70, pulse 68, temperature 98.5 F (36.9 C), temperature source Axillary, resp. rate 20, height  (1.6 m), weight 53.4 kg (117 lb 11.6 oz), SpO2 98 %.Body mass index is 20.86 kg/(m^2).  General Appearance: Fairly  Groomed  Patent attorney::  Minimal  Speech:  Slow  Volume:  Decreased  Mood:  Depressed, Dysphoric and Hopeless  Affect:  Constricted  Thought Process:  Disorganized  Orientation:  Other:  only to person  Thought Content:  Negative  Suicidal Thoughts:  Yes.  without intent/plan  Homicidal Thoughts:  No  Memory:  Immediate;   Fair Recent;   Poor Remote;   Poor  Judgement:  Impaired  Insight:  Shallow  Psychomotor Activity:  Decreased  Concentration:  Fair  Recall:  [employer] of Knowledge:Fair  Language: Fair  Akathisia:  No  Handed:  Right  AIMS (if indicated):     Assets:  Desire for Improvement  ADL's:    Cognition: Impaired,  Mild  Sleep:   poor   Medical Decision Making: Review or order clinical lab tests (1), Established Problem, Worsening (2), Review of Medication Regimen & Side Effects (2) and Review of New Medication or Change in Dosage (2)  Treatment Plan Summary: Daily contact with patient to assess and evaluate symptoms and progress in treatment.  Medication management:  -Continues Zoloft  daily for depression. - Trazodone  po Qhs as needed for insomnia.  Plan:  Recommend psychiatric Inpatient admission when  medically cleared. 1:1 Sitter for safety Disposition: as above  Thedore Mins, MD 10/13/2014 12:11 PM

## 2014-10-13 NOTE — Evaluation (Signed)
Physical Therapy Evaluation Patient Details Name: Barbara Beck MRN: [id number] DOB: [birth year] Today's Date: 10/13/2014   History of Present Illness  81 yo female admittd with MDD, suicidal ideation, syncope. Admitted to [facility] 9/18 then transferred to ED 9/21. Pt is Spanish speaking  Clinical Impression  On eval, pt required Min assist +2 for mobility-walked ~25 feet with 2 HHA. Pt's behavior is unpredictable so +2 is necessary. Pt is able to ambulate fairly well. Mobility is limited by pt's unwillingness to cooperate/participate fully. Pt follows commands inconsistently. Will follow and progress activity as able.    Follow Up Recommendations Supervision/Assistance - 24 hour;[facility]. (If pt does not d/c to [facility], recommend 24 hour supervision/assist.)    Equipment Recommendations  None recommended by PT    Recommendations for Other Services       Precautions / Restrictions Precautions Precautions: Fall Precaution Comments: behavior is unpredictable Restrictions Weight Bearing Restrictions: No      Mobility  Bed Mobility Overal bed mobility: Needs Assistance Bed Mobility: Supine to Sit;Sit to Supine     Supine to sit: Min assist Sit to supine: Min assist   General bed mobility comments: assist to encourage participation. pt very likely can perfrom task unassisted.  Transfers Overall transfer level: Needs assistance Equipment used: 2 person hand held assist Transfers: Sit to/from Stand Sit to Stand: Min assist         General transfer comment: assist to rise, stabilize, control descent. assist to encourage participation. pt very likely can perform task without physical assistance  Ambulation/Gait Ambulation/Gait assistance: Min assist;+2 physical assistance;+2 safety/equipment Ambulation Distance (Feet): 25 Feet Assistive device: 2 person hand held assist Gait Pattern/deviations: Step-through pattern;Decreased stride length;Narrow base of support     General Gait  Details: pt walked to bathroom with 1 person hand held assist. however once in bathroom pt became a little less cooperative and required +2 and physical assistance to mobilize.  Stairs            Wheelchair Mobility    Modified Rankin (Stroke Patients Only)       Balance Overall balance assessment: Needs assistance         Standing balance support: During functional activity Standing balance-Leahy Scale: Fair                               Pertinent Vitals/Pain Pain Assessment: No/denies pain    Home Living Family/patient expects to be discharged to:: Unsure Living Arrangements: Children               Additional Comments: no family present to provide info on home environment/PLOF    Prior Function                 Hand Dominance        Extremity/Trunk Assessment   Upper Extremity Assessment: Difficult to assess due to impaired cognition (and due to language barrier)           Lower Extremity Assessment: Difficult to assess due to impaired cognition;Generalized weakness (strength is at least 3/5. pt able to weightbear/ambulate)      Cervical / Trunk Assessment: Normal  Communication   Communication: Prefers language other than English  Cognition   Behavior During Therapy: Flat affect Overall Cognitive Status: Difficult to assess                      General Comments      Exercises  Assessment/Plan    PT Assessment Patient needs continued PT services  PT Diagnosis Generalized weakness;Difficulty walking;Altered mental status   PT Problem List Decreased mobility;Decreased balance;Decreased cognition;Decreased safety awareness;Decreased strength;Decreased activity tolerance  PT Treatment Interventions Gait training;Functional mobility training;Therapeutic activities;Patient/family education;Balance training;Therapeutic exercise   PT Goals (Current goals can be found in the Care Plan section) Acute Rehab PT  Goals Patient Stated Goal: none stated PT Goal Formulation: Patient unable to participate in goal setting Time For Goal Achievement: 10/27/14 Potential to Achieve Goals: Fair    Frequency Min 2X/week   Barriers to discharge        Co-evaluation               End of Session Equipment Utilized During Treatment: Gait belt Activity Tolerance: Patient tolerated treatment well (limited by pts unwillingness to participate/cooperate fully) Patient left: in bed;with nursing/sitter in room           Time: 1124-1141 PT Time Calculation (min) (ACUTE ONLY): 17 min   Charges:   PT Evaluation $Initial PT Evaluation Tier I: 1 Procedure     PT G Codes:        Rebeca Alert, MPT Pager: [phone]

## 2014-10-13 NOTE — Progress Notes (Signed)
[facility] PROGRESS NOTE  Barbara Beck EAV:[id number] DOB: [birth year] DOA: 10/09/2014 PCP: Lora Paula, MD  Assessment/Plan: Syncope -overall work neg, except for poor PO intake with ongoing depression and orthostatic changes on admission -will follow PT rec's -will resume use of maintenance IVF' if patient in agreement while inpatient -will repeat orthostatic VS in am  GERD (gastroesophageal reflux disease): continue PPI    Depression -Currently not controlled; this is contributing to decreased oral intake and participation.  -Patient is frustrated, still feels hopeless and with depressed/flact affect  -Psych recommending geriatric psych facility, but patient w/o insurance and family no looking for facilities that are far away  -will continue low dose sertraline and PRN trazodone for insomnia -will follow rec's                                                                                     Metabolic encephalopathy - Could be secondary to UTI; also significant language barrier interfering with communication. -patient feeling better and while talking to her in spanish was appropriate -will monitor and follow rec's from psych for further adjustment/treatment of her depression   UTI (urinary tract infection) - Could be contributing to metabolic encephalopathy. - UA suspicious for UTI. - culture with multiple microorganism; no dysuria -will complete antibiotic therapy, currently on PO regimen  Thrush -will continue tx with nystatin   HTN -will continue norvasc -BP overall is fairly well controlled.   Code Status: full Family Communication: no family at bedside Disposition Plan: Pending work up results   Consultants:  Psychiatry  Procedures:  EEG attempted; but patient decline/refuse it   Antibiotics:  Rocephin 9/21>>9/23  ceftin 9/23>>9/26  HPI/Subjective: Pt has no new complaints. Denies SI. Still no eating much. No fever, no CP. Flat affect  and depressed mood with intermittent crying episodes appreciated.  Objective: Filed Vitals:   10/13/14 0550  BP: 151/70  Pulse: 68  Temp: 98.5 F (36.9 C)  Resp: 20    Intake/Output Summary (Last 24 hours) at 10/13/14 1343 Last data filed at 10/13/14 1011  Gross per 24 hour  Intake   1080 ml  Output      0 ml  Net   1080 ml   Filed Weights   10/09/14 1848  Weight: 53.4 kg (117 lb 11.6 oz)    Exam:   General:  Patient in no acute distress, alert and awake X 2; able to follow commands, only speaks spanish. Denies CP and SOB. Still with depressed mood and experiencing intermittent crying episodes.  Cardiovascular: Regular rate and rhythm, no rubs  Respiratory: No increased work of breathing, equal chest rise, no wheezes  Abdomen: Soft, nondistended, nontender  Musculoskeletal: No cyanosis or clubbing  Data Reviewed: Basic Metabolic Panel:  Recent Labs Lab 10/09/14 1107 10/09/14 1353 10/09/14 2020 10/10/14 0200  NA 133*  --   --  136  K 3.6  --   --  3.5  CL 95*  --   --  103  CO2 28  --   --  27  GLUCOSE 113*  --   --  113*  BUN 14  --   --  11  CREATININE 0.60  --  0.57 0.70  CALCIUM 9.0  --   --  8.6*  MG  --  1.9  --   --    Liver Function Tests:  Recent Labs Lab 10/09/14 1107 10/10/14 0200  AST 62* 36  ALT 59* 41  ALKPHOS 89 81  BILITOT 0.6 0.7  PROT 7.4 6.6  ALBUMIN 3.6 3.1*   CBC:  Recent Labs Lab 10/09/14 1107 10/09/14 2020 10/10/14 0200  WBC 8.8 8.6 8.1  HGB 14.9 13.8 13.9  HCT 43.2 40.0 40.1  MCV 86.1 86.0 86.4  PLT 318 309 310   Cardiac Enzymes:  Recent Labs Lab 10/09/14 1353 10/09/14 2020 10/10/14 0200  TROPONINI <0.03 <0.03 <0.03   BNP (last 3 results)  Recent Labs  09/19/14 1950  BNP 30.1    CBG:  Recent Labs Lab 10/09/14 1003 10/09/14 1120  GLUCAP 122* 102*    Recent Results (from the past 240 hour(s))  Urine culture     Status: None   Collection Time: 10/09/14  7:46 PM  Result Value Ref Range  Status   Specimen Description URINE, RANDOM  Final   Special Requests NONE  Final   Culture   Final    MULTIPLE SPECIES PRESENT, SUGGEST RECOLLECTION Performed at [facility]    Report Status 10/11/2014 FINAL  Final     Studies: No results found.  Scheduled Meds: . amLODipine  10 mg Oral Daily  . cefUROXime  500 mg Oral BID WC  . enoxaparin (LOVENOX) injection  40 mg Subcutaneous Q24H  . famotidine  10 mg Oral Daily  . fluticasone  2 spray Each Nare Daily  . nystatin  5 mL Oral QID  . [START ON 10/14/2014] sertraline  25 mg Oral Daily   Continuous Infusions: . sodium chloride      Time spent: 30 minutes    Vassie Loll  [facility] Pager [phone]. If 7PM-7AM, please contact night-coverage at [email or web address], password [email or web address] 10/13/2014, 1:43 PM  LOS: 4 days

## 2014-10-14 NOTE — H&P (Signed)
[facility] History and Physical  Barbara Beck ZOX:[id number] DOB: [birth year] DOA: 10/09/2014  Referring physician:  PCP: Lora Paula, MD   Chief Complaint: Suicidal ideation/syncope  HPI:  81 year old female with multiple visits to the ER over the last several months for several reasons most recently last 2 visits related to depression, suicidal ideation, patient recently moved to [facility] in the last night and wants to stay with her family. Patient has not been able to adapt to her surroundings. She was to senior center during the day when her kids are at work. She has been more depressed and has lost interest in her activities of daily living. She has a poor appetite and has lost 50 pounds since January 2016. She has crying episodes at home but denies any delusions or hallucinations. Patient was admitted to behavioral health on 9/20, she refused her morning medications today, apparently became unresponsive in the bathroom but shallow breathing. Blood pressure at be a change was 140/100. Tachycardic in the 140s. Pulse ox 98% She also became hypoxic and was transferred to the ER for further evaluation.Pt noted to have excessive drooling, right sided mouth drooping after spitting out bottom dentures.Pt have been refusing to eat, take meds, drink fluids , unarousable in the ER at the time of my exam .       Review of Systems: negative for the following    Unable to perform ROS: Psychiatric disorder    Past Medical History  Diagnosis Date  . Hypertension ?   Marland Kitchen GERD (gastroesophageal reflux disease) ?   Marland Kitchen Suicidal ideation      Past Surgical History  Procedure Laterality Date  . Eye surgery Left     Cataract Surgery       Social History:  reports that she quit smoking about 30 years ago. She has never used smokeless tobacco. She reports that she does not drink alcohol or use illicit drugs.    Allergies  Allergen Reactions  . Aspirin Hives and Nausea Only   NOTHING THAT CONTAINS ASPIRIN    Family History  Problem Relation Age of Onset  . Cancer Daughter     breast with bone mets   . Hypertension Father           Prior to Admission medications   Medication Sig Start Date End Date Taking? Authorizing Provider  acetaminophen (TYLENOL) 325 MG tablet Take 2 tablets (650 mg total) by mouth 3 (three) times daily as needed for moderate pain. 03/11/14   Josalyn Funches, MD  acetaminophen-codeine (TYLENOL #3) 300-30 MG per tablet Take 1 tablet by mouth every 8 (eight) hours as needed for moderate pain or severe pain. 08/08/14   Josalyn Funches, MD  amLODipine (NORVASC) 10 MG tablet Take 1 tablet (10 mg total) by mouth daily. 08/08/14   Josalyn Funches, MD  Calcium Carb-Cholecalciferol (CALCIUM-VITAMIN D) 600-400 MG-UNIT TABS Take 1 tablet by mouth daily.     Historical Provider, MD  cyanocobalamin 500 MCG tablet Take 1 tablet (500 mcg total) by mouth daily. 02/28/14   Josalyn Funches, MD  fluticasone (FLONASE) 50 MCG/ACT nasal spray Place 2 sprays into both nostrils daily. Patient not taking: Reported on 09/19/2014 01/29/14   Dessa Phi, MD  lisinopril-hydrochlorothiazide (PRINZIDE,ZESTORETIC) 20-25 MG per tablet Take 1 tablet by mouth daily. 03/19/14   Josalyn Funches, MD  pyridOXINE (B-6) 50 MG tablet Take 1 tablet (50 mg total) by mouth daily. 02/28/14   Josalyn Funches, MD  ranitidine (ZANTAC) 150 MG tablet TAKE 1 TABLET  BY MOUTH 2 TIMES DAILY Patient taking differently: TAKE 1 TABLET BY MOUTH AT BEDTIME 08/02/14   Josalyn Funches, MD  Thiamine 50 MG CAPS Take 1 capsule (50 mg total) by mouth daily. 02/28/14   Dessa Phi, MD     Physical Exam: Filed Vitals:   10/13/14 1521 10/13/14 2140 10/14/14 0633 10/14/14 1357  BP: 151/63 125/55  151/56  Pulse: 67 60  61  Temp: 98.9 F (37.2 C) 98.9 F (37.2 C) 98.3 F (36.8 C) 98.8 F (37.1 C)  TempSrc: Oral Oral Axillary Oral  Resp: Height:      Weight:      SpO2: 99% 100%        Constitutional: Vital signs reviewed. Thin, dry mucous membranes , confused Head: Normocephalic and atraumatic  Ear: TM normal bilaterally  Mouth: no erythema or exudates, MMM  Eyes: PERRL, EOMI, conjunctivae normal, No scleral icterus.  Neck: Supple, Trachea midline normal ROM, No JVD, mass, thyromegaly, or carotid bruit present.  Cardiovascular: RRR, S1 normal, S2 normal, no MRG, pulses symmetric and intact bilaterally  Pulmonary/Chest: CTAB, no wheezes, rales, or rhonchi  Abdominal: Soft. Non-tender, non-distended, bowel sounds are normal, no masses, organomegaly, or guarding present.  GU: no CVA tenderness Musculoskeletal: Normal range of motion. She exhibits no edema.  Patient has global weakness.  Neurological: She is oriented to person, place, and time. No cranial nerve deficit.  Patient unable to hold her arms up longer than 3 seconds, unsure whether this is volitional. Hematology: no cervical, inginal, or axillary adenopathy.  Neurological: A&O x3, Strenght is normal and symmetric bilaterally, cranial nerve II-XII are grossly intact, no focal motor deficit, sensory intact to light touch bilaterally.  Skin: Warm, dry and intact. No rash, cyanosis, or clubbing.  Psychiatric: Normal mood and affect. speech and behavior is normal. Judgment and thought content normal. Cognition and memory are normal.      Data Review   Micro Results Recent Results (from the past 240 hour(s))  Urine culture     Status: None   Collection Time: 10/09/14  7:46 PM  Result Value Ref Range Status   Specimen Description URINE, RANDOM  Final   Special Requests NONE  Final   Culture   Final    MULTIPLE SPECIES PRESENT, SUGGEST RECOLLECTION Performed at [facility]    Report Status 10/11/2014 FINAL  Final    Radiology Reports Dg Chest 2 View  10/06/2014   CLINICAL DATA:  History of hypertension, psychiatric placement  EXAM: CHEST  2 VIEW  COMPARISON:  09/19/2014  FINDINGS: Mild cardiac  enlargement stable. Stable aortic arch calcification. Vascular pattern normal. Mild chronic bronchitic change. No consolidation or effusion.  IMPRESSION: No active cardiopulmonary disease.   Electronically Signed   By: Esperanza Heir M.D.   On: 10/06/2014 13:54   Dg Chest 2 View  09/19/2014   CLINICAL DATA:  Acute onset of lightheadedness, lethargy, diarrhea and shortness of breath. Initial encounter.  EXAM: CHEST  2 VIEW  COMPARISON:  Chest radiograph from 01/29/2014  FINDINGS: The lungs are well-aerated and clear. There is no evidence of focal opacification, pleural effusion or pneumothorax.  The heart is borderline normal in size. No acute osseous abnormalities are seen.  IMPRESSION: No acute cardiopulmonary process seen.   Electronically Signed   By: Roanna Raider M.D.   On: 09/19/2014 20:34   Ct Head Wo Contrast  10/09/2014   CLINICAL DATA:  Acute onset of unresponsiveness. Excessive drooling with fall.  Initial encounter.  EXAM: CT HEAD WITHOUT CONTRAST  TECHNIQUE: Contiguous axial images were obtained from the base of the skull through the vertex without intravenous contrast.  COMPARISON:  None.  FINDINGS: There is no evidence of acute intracranial hemorrhage, mass lesion, brain edema or extra-axial fluid collection. The ventricles and subarachnoid spaces are appropriately sized for age. There is no CT evidence of acute cortical infarction. There is evidence of minimal chronic periventricular white matter disease. Intracranial vascular calcifications are noted.  The visualized paranasal sinuses, mastoid air cells and middle ears are clear. The calvarium is intact.  IMPRESSION: No acute intracranial findings or explanation for the patient's symptoms.   Electronically Signed   By: Carey Bullocks M.D.   On: 10/09/2014 11:59   Mr Brain Wo Contrast  10/09/2014   CLINICAL DATA:  Acutely unresponsive. Initial encounter. Patient fell.  EXAM: MRI HEAD WITHOUT CONTRAST  TECHNIQUE: Multiplanar, multiecho pulse  sequences of the brain and surrounding structures were obtained without intravenous contrast.  COMPARISON:  Head CT earlier same day  FINDINGS: Diffusion imaging does not show any acute or subacute infarction. The brainstem is normal. No cerebellar insult. Within the cerebral hemispheres, there are mild chronic small-vessel ischemic changes within the deep white matter. No cortical or large vessel territory infarction. No mass lesion, hemorrhage, hydrocephalus or extra-axial collection. No pituitary mass. No inflammatory sinus disease. No skull or skullbase lesion.  IMPRESSION: No acute finding. Mild chronic small-vessel change of the cerebral hemispheric white matter.   Electronically Signed   By: Paulina Fusi M.D.   On: 10/09/2014 19:46   Portable Chest 1 View  10/09/2014   CLINICAL DATA:  Syncope, unresponsive for 5 minutes  EXAM: PORTABLE CHEST - 1 VIEW  COMPARISON:  10/06/2014  FINDINGS: Limited inspiratory effect. Heart size and vascular pattern are normal. Mild to moderate chronic appearing bronchitic change. No consolidation or effusion.  IMPRESSION: No active disease.   Electronically Signed   By: Esperanza Heir M.D.   On: 10/09/2014 13:55   Dg Abd 2 Views  10/09/2014   CLINICAL DATA:  81 year old female undergoing MRI clearance  EXAM: ABDOMEN - 2 VIEW  COMPARISON:  None.  FINDINGS: No abnormal metallic foreign body. The bowel gas pattern is normal. Degenerative disc disease noted at L4-L5 and L5-S1. No acute osseous lesion. No evidence of free air. Lung bases are clear.  IMPRESSION: No evidence of metallic foreign body.   Electronically Signed   By: Malachy Moan M.D.   On: 10/09/2014 16:51     CBC  Recent Labs Lab 10/09/14 1107 10/09/14 2020 10/10/14 0200  WBC 8.8 8.6 8.1  HGB 14.9 13.8 13.9  HCT 43.2 40.0 40.1  PLT 318 309 310  MCV 86.1 86.0 86.4  MCH 29.7 29.7 30.0  MCHC 34.5 34.5 34.7  RDW 12.7 12.9 13.0    Chemistries   Recent Labs Lab 10/09/14 1107 10/09/14 1353  10/09/14 2020 10/10/14 0200 10/13/14 1617  NA 133*  --   --  136 135  K 3.6  --   --  3.5 3.4*  CL 95*  --   --  103 101  CO2 28  --   --  27 27  GLUCOSE 113*  --   --  113* 171*  BUN 14  --   --  11 10  CREATININE 0.60  --  0.57 0.70 0.69  CALCIUM 9.0  --   --  8.6* 8.3*  MG  --  1.9  --   --   --  AST 62*  --   --  36  --   ALT 59*  --   --  41  --   ALKPHOS 89  --   --  81  --   BILITOT 0.6  --   --  0.7  --    ------------------------------------------------------------------------------------------------------------------ estimated creatinine clearance is 42.5 mL/min (by C-G formula based on Cr of 0.69). ------------------------------------------------------------------------------------------------------------------ No results for input(s): HGBA1C in the last 72 hours. ------------------------------------------------------------------------------------------------------------------ No results for input(s): CHOL, HDL, LDLCALC, TRIG, CHOLHDL, LDLDIRECT in the last 72 hours. ------------------------------------------------------------------------------------------------------------------ No results for input(s): TSH, T4TOTAL, T3FREE, THYROIDAB in the last 72 hours.  Invalid input(s): FREET3 ------------------------------------------------------------------------------------------------------------------ No results for input(s): VITAMINB12, FOLATE, FERRITIN, TIBC, IRON, RETICCTPCT in the last 72 hours.  Coagulation profile  Recent Labs Lab 10/09/14 1205  INR 1.05    No results for input(s): DDIMER in the last 72 hours.  Cardiac Enzymes  Recent Labs Lab 10/09/14 1353 10/09/14 2020 10/10/14 0200  TROPONINI <0.03 <0.03 <0.03   ------------------------------------------------------------------------------------------------------------------ Invalid input(s): POCBNP   CBG:  Recent Labs Lab 10/09/14 1003 10/09/14 1120  GLUCAP 122* 102*       EKG:  Independently reviewed. 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 Will appreciate a site continues to follow the patient here   UTI-start the patient on Rocephin     Syncope and collapse-CT without contrast negative, repeat chest x-ray to rule out pneumonia, patient tachycardic with a heart rate of 140, obtain EKG and admitted to telemetry, obtain 2-D echo, TSH, cycle cardiac enzymes, MRI of the brain to rule out CVA  Hypertension continue Norvasc and prn hydralazine  Dehydration due to poor oral intake secondary to depression, started the patient on IV fluids  Gastro esophageal reflux disease-continue PPI   Code Status:   Full, tried to reach the daughter to confirm code status , left voicemail, Family Communication: bedside Disposition Plan: admit   Total time spent 55 minutes.Greater than 50% of this time was spent in counseling, explanation of diagnosis, planning of further management, and coordination of care  [facility] Pager [phone]  If 7PM-7AM, please contact night-coverage [email or web address] Password [facility] 10/14/2014, 7:56 PM

## 2014-10-14 NOTE — Progress Notes (Signed)
CSW consulted with Dr. Dub Mikes at [facility] in regards to determining if patient is appropriate for [facility]. Dr. Dub Mikes requested CSW consult with [facility] Minerva Areola.  CSW spoke with [facility] who requested information in regard to patient's ADLs. CSW notified [facility] AC that per patient's RN, patient is independent with ADLs but has communication barrier due to only speaking Spanish. [facility] AC to review clinicals to determine if patient is appropriate for [facility] inpt tx.    Janann Colonel., MSW, LCSW Clinical Social Worker [phone]

## 2014-10-14 NOTE — Progress Notes (Signed)
[facility] PROGRESS NOTE  Barbara Beck ZOX:[id number] DOB: 1927/12/15 DOA: 10/09/2014 PCP: Lora Paula, MD  Assessment/Plan: Syncope -overall work neg, except for poor PO intake with ongoing depression and orthostatic changes on admission -will follow PT rec's -will resume use of maintenance IVF' if patient in agreement while inpatient  GERD (gastroesophageal reflux disease): continue PPI    Depression -Currently not controlled; this is contributing to decreased oral intake and participation.  -Patient is frustrated, still feels hopeless and with depressed/flact affect  -Psych recommending geriatric psych facility, but patient w/o insurance and family no looking for facilities that are far away  -will continue low dose sertraline and PRN trazodone for insomnia -will follow rec's                                                                                     Metabolic encephalopathy - Could be secondary to UTI; also significant language barrier interfering with communication. -patient feeling better and while talking to her in spanish was appropriate -will monitor and follow rec's from psych for further adjustment/treatment of her depression   UTI (urinary tract infection) - Could be contributing to metabolic encephalopathy. - UA suspicious for UTI. - culture with multiple microorganism; no dysuria -will complete antibiotic therapy, currently on PO regimen  Thrush -will continue tx with nystatin   HTN -will continue norvasc -BP overall is fairly well controlled.   Code Status: full Family Communication: no family at bedside Disposition Plan: Pending work up results   Consultants:  Psychiatry  Procedures:  EEG attempted; but patient decline/refuse it   Antibiotics:  Rocephin 9/21>>9/23  ceftin 9/23>>9/26  HPI/Subjective: Pt has no new complaints. Continue to be very depressed and with flat affect. At times refused medications. Big impact on  language barrier   Objective: Filed Vitals:   10/14/14 1357  BP: 151/56  Pulse: 61  Temp: 98.8 F (37.1 C)  Resp:     Intake/Output Summary (Last 24 hours) at 10/14/14 1535 Last data filed at 10/14/14 1320  Gross per 24 hour  Intake   1240 ml  Output      0 ml  Net   1240 ml   Filed Weights   10/09/14 1848  Weight: 53.4 kg (117 lb 11.6 oz)    Exam:   General:  Patient in no acute distress, alert, oriented X 2 and awake; able to follow commands, only speaks spanish. Denies CP and SOB. Still with depressed mood and experiencing intermittent crying episodes and frustration that interfere with her care.  Cardiovascular: Regular rate and rhythm, no rubs  Respiratory: No increased work of breathing, equal chest rise, no wheezes  Abdomen: Soft, nondistended, nontender  Musculoskeletal: No cyanosis or clubbing  Data Reviewed: Basic Metabolic Panel:  Recent Labs Lab 10/09/14 1107 10/09/14 1353 10/09/14 2020 10/10/14 0200 10/13/14 1617  NA 133*  --   --  136 135  K 3.6  --   --  3.5 3.4*  CL 95*  --   --  103 101  CO2 28  --   --  27 27  GLUCOSE 113*  --   --  113* 171*  BUN 14  --   --  11 10  CREATININE 0.60  --  0.57 0.70 0.69  CALCIUM 9.0  --   --  8.6* 8.3*  MG  --  1.9  --   --   --    Liver Function Tests:  Recent Labs Lab 10/09/14 1107 10/10/14 0200  AST 62* 36  ALT 59* 41  ALKPHOS 89 81  BILITOT 0.6 0.7  PROT 7.4 6.6  ALBUMIN 3.6 3.1*   CBC:  Recent Labs Lab 10/09/14 1107 10/09/14 2020 10/10/14 0200  WBC 8.8 8.6 8.1  HGB 14.9 13.8 13.9  HCT 43.2 40.0 40.1  MCV 86.1 86.0 86.4  PLT 318 309 310   Cardiac Enzymes:  Recent Labs Lab 10/09/14 1353 10/09/14 2020 10/10/14 0200  TROPONINI <0.03 <0.03 <0.03   BNP (last 3 results)  Recent Labs  09/19/14 1950  BNP 30.1    CBG:  Recent Labs Lab 10/09/14 1003 10/09/14 1120  GLUCAP 122* 102*    Recent Results (from the past 240 hour(s))  Urine culture     Status: None    Collection Time: 10/09/14  7:46 PM  Result Value Ref Range Status   Specimen Description URINE, RANDOM  Final   Special Requests NONE  Final   Culture   Final    MULTIPLE SPECIES PRESENT, SUGGEST RECOLLECTION Performed at [facility]    Report Status 10/11/2014 FINAL  Final     Studies: No results found.  Scheduled Meds: . amLODipine  10 mg Oral Daily  . cefUROXime  500 mg Oral BID WC  . enoxaparin (LOVENOX) injection  40 mg Subcutaneous Q24H  . famotidine  10 mg Oral Daily  . fluticasone  2 spray Each Nare Daily  . nystatin  5 mL Oral QID  . sertraline  25 mg Oral Daily   Continuous Infusions: . sodium chloride 50 mL/hr at 10/14/14 1022    Time spent: 30 minutes    Vassie Loll  [facility] Pager [phone]. If 7PM-7AM, please contact night-coverage at [email or web address], password [facility] 10/14/2014, 3:35 PM  LOS: 5 days

## 2014-10-15 NOTE — Progress Notes (Signed)
Physical Therapy Treatment Patient Details Name: Barbara Beck MRN: [id number] DOB: 14-Jun-1927 Today's Date: 10/15/2014    History of Present Illness 81 yo female admittd with MDD, suicidal ideation, syncope. Ad[facility] to [facility] 9/18 then transferred to ED 9/21. Pt is Spanish speaking    PT Comments    MD in room speaking to pt in Spanish motivating her and addressing her concerns.  Pt stated she would like to take a shower.  With NT assisted pt OOB to bathroom for toileting/shower/brushing then amb an increased distance hand held assist.  Good alternating gait.  No LOB.  Follow Up Recommendations  Other (comment) (Behavior Health)     Equipment Recommendations  None recommended by PT    Recommendations for Other Services       Precautions / Restrictions Precautions Precautions: Fall Precaution Comments: behavior is unpredictable Restrictions Weight Bearing Restrictions: No    Mobility  Bed Mobility Overal bed mobility: Needs Assistance Bed Mobility: Supine to Sit     Supine to sit: Min guard;Supervision     General bed mobility comments: motioned to pt to get OOB and walk to the bathroom.   Transfers Overall transfer level: Needs assistance Equipment used: None Transfers: Sit to/from Stand Sit to Stand: Supervision;Min guard         General transfer comment: motioned to pt to get up from bed onto toilet then off toilet to shower seat. pt willing and able but not always cooperative.   Ambulation/Gait Ambulation/Gait assistance: Min guard;Min assist Ambulation Distance (Feet): 150 Feet Assistive device: None Gait Pattern/deviations: Step-through pattern Gait velocity: WFL   General Gait Details: amb to bathroom for tioleting/shower/brush teeth at sink then amb in hallway HHA.  No LOB.     Stairs            Wheelchair Mobility    Modified Rankin (Stroke Patients Only)       Balance                                    Cognition    Behavior During Therapy: Flat affect Overall Cognitive Status: Difficult to assess                      Exercises      General Comments        Pertinent Vitals/Pain Pain Assessment: No/denies pain    Home Living                      Prior Function            PT Goals (current goals can now be found in the care plan section) Progress towards PT goals: Progressing toward goals    Frequency  Min 2X/week    PT Plan      Co-evaluation             End of Session Equipment Utilized During Treatment: Gait belt Activity Tolerance: Patient tolerated treatment well Patient left: in chair;with call bell/phone within reach;with family/visitor present     Time: 1210-1235 PT Time Calculation (min) (ACUTE ONLY): 25 min  Charges:  $Gait Training: 8-22 mins $Therapeutic Activity: 8-22 mins                    G Codes:      Felecia Shelling  PTA [facility]  Acute  Rehab Pager      [phone]

## 2014-10-15 NOTE — Plan of Care (Signed)
Problem: Phase II Progression Outcomes Goal: Progress activity as tolerated unless otherwise ordered Outcome: Completed/Met Date Met:  10/15/14 Patient ambulating and showering bin bathroom with assistance

## 2014-10-15 NOTE — Progress Notes (Signed)
CSW received phone call from [place] at [facility], and they are requesting an EKG and Chest x-ray to be faxed over. CSW faxed EKG and Chest X-ray from 9/21 over to [facility].   Fernande Boyden, [facility] Clinical Social Worker [place] [phone]

## 2014-10-15 NOTE — Progress Notes (Signed)
[facility] PROGRESS NOTE  Barbara Beck ZOX:[id number] DOB: [birth year] DOA: 10/09/2014 PCP: Lora Paula, MD  Brief summary  81 y/o female with pmh significant for HTN, GERD; transferred from [facility] due to near syncope event. Patient was admitted at [facility] secondary to major depressive disorder. She has not been eating and drinking properly and ended almost passing out. Positive orthostatic VS on admission. Work up is negative. Found to have UTI and has completed antibiotic therapy for it. Now waiting for psych recommendations regarding going back to [facility] for further treatment of her depression or what??  Assessment/Plan: Syncope -overall work neg, except for poor PO intake with ongoing depression and orthostatic changes on admission -PT (recommending HHPT), patient mood interfering in her capacity to do more  -will continue maintenance IVF while inpatient  GERD (gastroesophageal reflux disease): continue PPI    Depression -Currently not controlled; this is contributing to decreased oral intake and participation.  -Patient is frustrated, still feeling hopeless and with depressed/flact affect  -Psych recommending geriatric psych facility, but patient w/o insurance and family no looking for facilities that are far away  -will continue low dose sertraline and PRN trazodone for insomnia -will follow rec's                                                                                     Metabolic encephalopathy -Could be secondary to UTI; also significant language barrier interfering with communication. -Patient feeling better and while talking to her in spanish content of conversation was appropriate -will monitor and follow rec's from psych for further adjustment/treatment of her depression   UTI (urinary tract infection) -Could be contributing to metabolic encephalopathy. -UA suggested UTI. -culture with multiple microorganism; none specific  -has completed antibiotic therapy  and denies dysuria   Thrush -will continue tx with nystatin  -essentially resolved   HTN -will continue norvasc -BP overall is fairly well controlled.  Code Status: full Family Communication: no family at bedside Disposition Plan: medically stable and waiting on final decision from psychiatry rec's regarding discharge plans.   Consultants:  Psychiatry  Procedures:  EEG attempted; but patient decline/refuse it   Antibiotics:  Rocephin 9/21>>9/23  ceftin 9/23>>9/26  HPI/Subjective: Pt has no new complaints. Continue to experience depression. Positive flat affect. Big impact on language barrier   Objective: Filed Vitals:   10/14/14 2156  BP: 134/52  Pulse: 55  Temp: 98.5 F (36.9 C)  Resp: 18    Intake/Output Summary (Last 24 hours) at 10/15/14 1111 Last data filed at 10/15/14 0944  Gross per 24 hour  Intake 2484.17 ml  Output      0 ml  Net 2484.17 ml   Filed Weights   10/09/14 1848  Weight: 53.4 kg (117 lb 11.6 oz)    Exam:   General:  Patient in no acute distress, alert, oriented X 2 and awake; able to follow commands, only speaks spanish. Denies CP and SOB. Still depressed and with flat affect. But medically stable. Thrush essentially resolved.  Cardiovascular: Regular rate and rhythm, no rubs  Respiratory: No increased work of breathing, equal chest rise, no wheezes  Abdomen: Soft, nondistended, non-tender, positive BS  Musculoskeletal: No cyanosis or clubbing  Data Reviewed: Basic Metabolic Panel:  Recent Labs Lab 10/09/14 1107 10/09/14 1353 10/09/14 2020 10/10/14 0200 10/13/14 1617  NA 133*  --   --  136 135  K 3.6  --   --  3.5 3.4*  CL 95*  --   --  103 101  CO2 28  --   --  27 27  GLUCOSE 113*  --   --  113* 171*  BUN 14  --   --  11 10  CREATININE 0.60  --  0.57 0.70 0.69  CALCIUM 9.0  --   --  8.6* 8.3*  MG  --  1.9  --   --   --    Liver Function Tests:  Recent Labs Lab 10/09/14 1107 10/10/14 0200  AST 62* 36  ALT  59* 41  ALKPHOS 89 81  BILITOT 0.6 0.7  PROT 7.4 6.6  ALBUMIN 3.6 3.1*   CBC:  Recent Labs Lab 10/09/14 1107 10/09/14 2020 10/10/14 0200  WBC 8.8 8.6 8.1  HGB 14.9 13.8 13.9  HCT 43.2 40.0 40.1  MCV 86.1 86.0 86.4  PLT 318 309 310   Cardiac Enzymes:  Recent Labs Lab 10/09/14 1353 10/09/14 2020 10/10/14 0200  TROPONINI <0.03 <0.03 <0.03   BNP (last 3 results)  Recent Labs  09/19/14 1950  BNP 30.1    CBG:  Recent Labs Lab 10/09/14 1003 10/09/14 1120  GLUCAP 122* 102*    Recent Results (from the past 240 hour(s))  Urine culture     Status: None   Collection Time: 10/09/14  7:46 PM  Result Value Ref Range Status   Specimen Description URINE, RANDOM  Final   Special Requests NONE  Final   Culture   Final    MULTIPLE SPECIES PRESENT, SUGGEST RECOLLECTION Performed at [facility]    Report Status 10/11/2014 FINAL  Final     Studies: No results found.  Scheduled Meds: . amLODipine  10 mg Oral Daily  . enoxaparin (LOVENOX) injection  40 mg Subcutaneous Q24H  . famotidine  10 mg Oral Daily  . fluticasone  2 spray Each Nare Daily  . nystatin  5 mL Oral QID  . sertraline  25 mg Oral Daily   Continuous Infusions: . sodium chloride 50 mL/hr at 10/14/14 1022    Time spent: 30 minutes    Vassie Loll  [facility] Pager [phone]. If 7PM-7AM, please contact night-coverage at [email or web address], password [facility] 10/15/2014, 11:11 AM  LOS: 6 days

## 2014-10-15 NOTE — Progress Notes (Signed)
CSW spoke with patient's family regarding recommendation for geri-psych. Family is hesitant about geri-psych placement for the patient due to location and language barriers. CSW went to speak with patient who currently still has a Comptroller and is actively suicidal. CSW to discuss with Psych MD about re-evaluating the patient on tomorrow 10/16/14. CSW has made Attending MD aware of plan for patient as well.   CSW also spoke with family member Ceceila regarding discharge plans. Family is aware that patient will be re-evaluated tomorrow due to being actively suicidal. Family informed CSW that she has attempted to get in contact with Dr. Dub Mikes regarding the patient returning to [facility]. Family is agreeable to geri-psych for patient if [facility] is unable to take her. CSW to fax [facility] information out to [facility] and to follow up with [facility] regarding bed availability.   CSW will continue to follow.   Fernande Boyden, [facility] Clinical Social Worker [place] [facility] [phone]

## 2014-10-16 ENCOUNTER — Encounter (HOSPITAL_COMMUNITY): Payer: Self-pay | Admitting: *Deleted

## 2014-10-16 ENCOUNTER — Inpatient Hospital Stay (HOSPITAL_COMMUNITY)
Admission: AD | Admit: 2014-10-16 | Discharge: 2014-10-18 | DRG: 885 | Disposition: A | Discharge status: Other Acute Inpatient Hospital | Payer: Federal, State, Local not specified - Other | Source: Intra-hospital | Attending: Psychiatry | Admitting: Psychiatry

## 2014-10-16 DIAGNOSIS — F332 Major depressive disorder, recurrent severe without psychotic features: Secondary | ICD-10-CM | POA: Diagnosis present

## 2014-10-16 DIAGNOSIS — K219 Gastro-esophageal reflux disease without esophagitis: Secondary | ICD-10-CM | POA: Diagnosis present

## 2014-10-16 DIAGNOSIS — I10 Essential (primary) hypertension: Secondary | ICD-10-CM | POA: Diagnosis present

## 2014-10-16 HISTORY — DX: Major depressive disorder, single episode, unspecified: F32.9

## 2014-10-16 HISTORY — DX: Anxiety disorder, unspecified: F41.9

## 2014-10-16 HISTORY — DX: Depression, unspecified: F32.A

## 2014-10-16 MED ORDER — SERTRALINE HCL 25 MG PO TABS
25.0000 mg | ORAL_TABLET | Freq: Every day | ORAL | Status: DC
Start: 2014-10-16 — End: 2014-10-21

## 2014-10-16 MED ORDER — ALUM & MAG HYDROXIDE-SIMETH 200-200-20 MG/5ML PO SUSP: 30.0000 mL | ORAL | Status: DC | PRN

## 2014-10-16 MED ORDER — FLUTICASONE PROPIONATE 50 MCG/ACT NA SUSP
2.0000 | Freq: Every day | NASAL | Status: DC
  Administered 2014-10-17: 2 via NASAL
  Filled 2014-10-16 (×2): qty 16

## 2014-10-16 MED ORDER — AMLODIPINE BESYLATE 10 MG PO TABS
10.0000 mg | ORAL_TABLET | Freq: Every day | ORAL | Status: DC
  Administered 2014-10-16 – 2014-10-18 (×3): 10 mg via ORAL
  Filled 2014-10-16 (×6): qty 1

## 2014-10-16 MED ORDER — ACETAMINOPHEN 325 MG PO TABS: 650.0000 mg | ORAL_TABLET | Freq: Four times a day (QID) | ORAL | Status: DC | PRN

## 2014-10-16 MED ORDER — NYSTATIN 100000 UNIT/ML MT SUSP: 5.0000 mL | Freq: Four times a day (QID) | OROMUCOSAL | Status: DC

## 2014-10-16 MED ORDER — MAGNESIUM HYDROXIDE 400 MG/5ML PO SUSP: 30.0000 mL | Freq: Every day | ORAL | Status: DC | PRN

## 2014-10-16 MED ORDER — ENSURE ENLIVE PO LIQD: 237.0000 mL | Freq: Three times a day (TID) | ORAL | Status: DC

## 2014-10-16 MED ORDER — CALCIUM CARBONATE-VITAMIN D 500-200 MG-UNIT PO TABS
1.0000 | ORAL_TABLET | Freq: Every day | ORAL | Status: DC
  Administered 2014-10-16 – 2014-10-18 (×3): 1 via ORAL
  Filled 2014-10-16 (×7): qty 1

## 2014-10-16 MED ORDER — SERTRALINE HCL 50 MG PO TABS
25.0000 mg | ORAL_TABLET | Freq: Every day | ORAL | Status: DC
  Administered 2014-10-16 – 2014-10-18 (×3): 25 mg via ORAL
  Filled 2014-10-16 (×6): qty 1

## 2014-10-16 NOTE — Progress Notes (Addendum)
Patient's second admission to [facility], voluntary.  Patient denied SI and HI, contracts for safety.  Stated she does not hear voices but does hear noises.  Head feels like it is going in circles.  Denied visual hallucinations.  When at home, patient stated she does not want to bathe or dress.  Language barrier causes her to feel depressed and "very small, low self esteem".  Started losing weight in December 2015 and weighed approximately 140 lb in Dec.  Started losing weight, intestional disorder, which caused her to lose weight, dairrhea.  Stopped eating as much due to frequent diarrhea.  Stated she also got dentures approximately one month which are uncomfortable and she lost more weight.  Lost appetite from the depression.  Rated depression 10, denied anxiety, hopeless #10.  Denied alcohol, THC, cigarettes, and all drugs.  Patient stated she sees her regular MD every 3 months, hospitalized 3 times at regular hospital in past year and two hospitalizations at [facility] in the past yr.  Dentures loose, R eye decreased vision, hard of hearing.  Son and his wife brought patient to [facility].  Came to [place] several months ago and misses her friends, family, and several funerals.  Stated her legs feel week.  Note in chart stated patient's son committed suicide in October 2014.  Skin, no scars, no surgeries.  Does not eat meat, eggs.  Children 2 girls and 2 boys.  Husband deceased.  Patient stated she has low self esteem, does not want to dress/bathe, decreased appetite, does not enjoy eating food.  Lives with her daughter Merlene Laughter.  Family takes her to MD appointments.   Fall risk information given and discussed with patient who stated she understood and had no questions.   Food/drink given patient who was oriented to unit. Locker 7 has small pink pillow.  RN attempted to put zero at end of alcohol assessment, but computer would not accept.  Patient never has drank alcohol or used drugs.  Informed charge nurse.

## 2014-10-16 NOTE — Discharge Summary (Signed)
Physician Discharge Summary  Barbara Beck WUJ:[id number] DOB: 1927/04/13 DOA: 10/09/2014  PCP: Lora Paula, MD  Admit date: 10/09/2014 Discharge date: 10/16/2014  Time spent: 35 minutes  Recommendations for Outpatient Follow-up:    Discharge Diagnoses:    Syncope   MDD (major depressive disorder), recurrent severe, without psychosis   GERD (gastroesophageal reflux disease)   Depression   Syncope and collapse   Metabolic encephalopathy   UTI (urinary tract infection)   Dehydration   Thrush   Discharge Condition: stable.   Diet recommendation: bland diet  Filed Weights   10/09/14 1848  Weight: 53.4 kg (117 lb 11.6 oz)    History of present illness:  81 y/o female with pmh significant for HTN, GERD; transferred from [facility] due to near syncope event. Patient was admitted at [facility] secondary to major depressive disorder. She has not been eating and drinking properly and ended almost passing out. Positive orthostatic VS on admission. Work up is negative. Found to have UTI and has completed antibiotic therapy for it. Now waiting for psych recommendations regarding going back to [facility] for further treatment of her depression or what??   Hospital Course:   Assessment/Plan: Syncope -overall work neg, except for poor PO intake with ongoing depression and orthostatic changes on admission -PT (recommending HHPT), patient mood interfering in her capacity to do more  -will continue maintenance IVF while inpatient -resolved.   GERD (gastroesophageal reflux disease): continue PPI   Depression -Currently not controlled; this is contributing to decreased oral intake and participation.  -Patient is frustrated, still feeling hopeless and with depressed/flact affect  -Psych recommending geriatric psych facility, but patient w/o insurance and family no looking for facilities that are far away  -will continue low dose sertraline and PRN trazodone for insomnia -will follow  rec's  -Transfer to [facility] today.   Metabolic encephalopathy -Could be secondary to UTI; also significant language barrier interfering with communication. -Patient feeling better and while talking to her in spanish content of conversation was appropriate -will monitor and follow rec's from psych for further adjustment/treatment of her depression   UTI (urinary tract infection) -Could be contributing to metabolic encephalopathy. -UA suggested UTI. -culture with multiple microorganism; none specific  -has completed antibiotic therapy and denies dysuria   Thrush -will continue tx with nystatin  -essentially resolved   HTN -will continue norvasc -BP overall is fairly well controlled.  Procedures:  none  Consultations: none Discharge Exam: Filed Vitals:   10/16/14 0524  BP: 143/63  Pulse: 62  Temp: 98.5 F (36.9 C)  Resp: 18    General: NAD Cardiovascular: S 1, S 2 RRR Respiratory: CTA  Discharge Instructions   Discharge Instructions    Diet - low sodium heart healthy    Complete by:  As directed      Increase activity slowly    Complete by:  As directed           Current Discharge Medication List    START taking these medications   Details  feeding supplement, ENSURE ENLIVE, (ENSURE ENLIVE) LIQD Take 237 mLs by mouth 3 (three) times daily between meals. Qty: 237 mL, Refills: 12    nystatin (MYCOSTATIN) 100000 UNIT/ML suspension Take 5 mLs (500,000 Units total) by mouth 4 (four) times daily. Qty: 60 mL, Refills: 0    sertraline (ZOLOFT) 25 MG tablet Take 1 tablet (25 mg total) by mouth daily. Qty: 30 tablet, Refills: 0      CONTINUE these medications which have NOT CHANGED  Details  acetaminophen (TYLENOL) 325 MG tablet Take 2 tablets (650 mg total) by mouth 3 (three) times daily as needed for moderate pain. Qty: 50 tablet, Refills: 1    amLODipine (NORVASC) 10 MG tablet  Take 1 tablet (10 mg total) by mouth daily. Qty: 90 tablet, Refills: 3    Calcium Carb-Cholecalciferol (CALCIUM-VITAMIN D) 600-400 MG-UNIT TABS Take 1 tablet by mouth daily.     cyanocobalamin 500 MCG tablet Take 1 tablet (500 mcg total) by mouth daily. Qty: 90 tablet, Refills: 3    fluticasone (FLONASE) 50 MCG/ACT nasal spray Place 2 sprays into both nostrils daily. Qty: 16 g, Refills: 6   Associated Diagnoses: Allergic rhinitis, unspecified allergic rhinitis type    pyridOXINE (B-6) 50 MG tablet Take 1 tablet (50 mg total) by mouth daily. Qty: 90 tablet, Refills: 1    ranitidine (ZANTAC) 150 MG tablet TAKE 1 TABLET BY MOUTH 2 TIMES DAILY Qty: 60 tablet, Refills: 2    Thiamine 50 MG CAPS Take 1 capsule (50 mg total) by mouth daily. Qty: 90 capsule, Refills: 3      STOP taking these medications     acetaminophen-codeine (TYLENOL #3) 300-30 MG per tablet      lisinopril-hydrochlorothiazide (PRINZIDE,ZESTORETIC) 20-25 MG per tablet        Allergies  Allergen Reactions  . Aspirin Hives and Nausea Only    NOTHING THAT CONTAINS ASPIRIN   Follow-up Information    Follow up with Lora Paula, MD.   Specialty:  Family Medicine   Contact information:   [address] [place] [address] [phone]        The results of significant diagnostics from this hospitalization (including imaging, microbiology, ancillary and laboratory) are listed below for reference.    Significant Diagnostic Studies: Dg Chest 2 View  10/06/2014   CLINICAL DATA:  History of hypertension, psychiatric placement  EXAM: CHEST  2 VIEW  COMPARISON:  09/19/2014  FINDINGS: Mild cardiac enlargement stable. Stable aortic arch calcification. Vascular pattern normal. Mild chronic bronchitic change. No consolidation or effusion.  IMPRESSION: No active cardiopulmonary disease.   Electronically Signed   By: Esperanza Heir M.D.   On: 10/06/2014 13:54   Dg Chest 2 View  09/19/2014   CLINICAL DATA:   Acute onset of lightheadedness, lethargy, diarrhea and shortness of breath. Initial encounter.  EXAM: CHEST  2 VIEW  COMPARISON:  Chest radiograph from 01/29/2014  FINDINGS: The lungs are well-aerated and clear. There is no evidence of focal opacification, pleural effusion or pneumothorax.  The heart is borderline normal in size. No acute osseous abnormalities are seen.  IMPRESSION: No acute cardiopulmonary process seen.   Electronically Signed   By: Roanna Raider M.D.   On: 09/19/2014 20:34   Ct Head Wo Contrast  10/09/2014   CLINICAL DATA:  Acute onset of unresponsiveness. Excessive drooling with fall. Initial encounter.  EXAM: CT HEAD WITHOUT CONTRAST  TECHNIQUE: Contiguous axial images were obtained from the base of the skull through the vertex without intravenous contrast.  COMPARISON:  None.  FINDINGS: There is no evidence of acute intracranial hemorrhage, mass lesion, brain edema or extra-axial fluid collection. The ventricles and subarachnoid spaces are appropriately sized for age. There is no CT evidence of acute cortical infarction. There is evidence of minimal chronic periventricular white matter disease. Intracranial vascular calcifications are noted.  The visualized paranasal sinuses, mastoid air cells and middle ears are clear. The calvarium is intact.  IMPRESSION: No acute intracranial findings or explanation  for the patient's symptoms.   Electronically Signed   By: Carey Bullocks M.D.   On: 10/09/2014 11:59   Mr Brain Wo Contrast  10/09/2014   CLINICAL DATA:  Acutely unresponsive. Initial encounter. Patient fell.  EXAM: MRI HEAD WITHOUT CONTRAST  TECHNIQUE: Multiplanar, multiecho pulse sequences of the brain and surrounding structures were obtained without intravenous contrast.  COMPARISON:  Head CT earlier same day  FINDINGS: Diffusion imaging does not show any acute or subacute infarction. The brainstem is normal. No cerebellar insult. Within the cerebral hemispheres, there are mild chronic  small-vessel ischemic changes within the deep white matter. No cortical or large vessel territory infarction. No mass lesion, hemorrhage, hydrocephalus or extra-axial collection. No pituitary mass. No inflammatory sinus disease. No skull or skullbase lesion.  IMPRESSION: No acute finding. Mild chronic small-vessel change of the cerebral hemispheric white matter.   Electronically Signed   By: Paulina Fusi M.D.   On: 10/09/2014 19:46   Portable Chest 1 View  10/09/2014   CLINICAL DATA:  Syncope, unresponsive for 5 minutes  EXAM: PORTABLE CHEST - 1 VIEW  COMPARISON:  10/06/2014  FINDINGS: Limited inspiratory effect. Heart size and vascular pattern are normal. Mild to moderate chronic appearing bronchitic change. No consolidation or effusion.  IMPRESSION: No active disease.   Electronically Signed   By: Esperanza Heir M.D.   On: 10/09/2014 13:55   Dg Abd 2 Views  10/09/2014   CLINICAL DATA:  81 year old female undergoing MRI clearance  EXAM: ABDOMEN - 2 VIEW  COMPARISON:  None.  FINDINGS: No abnormal metallic foreign body. The bowel gas pattern is normal. Degenerative disc disease noted at L4-L5 and L5-S1. No acute osseous lesion. No evidence of free air. Lung bases are clear.  IMPRESSION: No evidence of metallic foreign body.   Electronically Signed   By: Malachy Moan M.D.   On: 10/09/2014 16:51    Microbiology: Recent Results (from the past 240 hour(s))  Urine culture     Status: None   Collection Time: 10/09/14  7:46 PM  Result Value Ref Range Status   Specimen Description URINE, RANDOM  Final   Special Requests NONE  Final   Culture   Final    MULTIPLE SPECIES PRESENT, SUGGEST RECOLLECTION Performed at [facility]    Report Status 10/11/2014 FINAL  Final     Labs: Basic Metabolic Panel:  Recent Labs Lab 10/09/14 1353 10/09/14 2020 10/10/14 0200 10/13/14 1617  NA  --   --  136 135  K  --   --  3.5 3.4*  CL  --   --  103 101  CO2  --   --  27 27  GLUCOSE  --   --  113*  171*  BUN  --   --  11 10  CREATININE  --  0.57 0.70 0.69  CALCIUM  --   --  8.6* 8.3*  MG 1.9  --   --   --    Liver Function Tests:  Recent Labs Lab 10/10/14 0200  AST 36  ALT 41  ALKPHOS 81  BILITOT 0.7  PROT 6.6  ALBUMIN 3.1*   No results for input(s): LIPASE, AMYLASE in the last 168 hours. No results for input(s): AMMONIA in the last 168 hours. CBC:  Recent Labs Lab 10/09/14 2020 10/10/14 0200  WBC 8.6 8.1  HGB 13.8 13.9  HCT 40.0 40.1  MCV 86.0 86.4  PLT 309 310   Cardiac Enzymes:  Recent Labs Lab  10/09/14 1353 10/09/14 2020 10/10/14 0200  TROPONINI <0.03 <0.03 <0.03   BNP: BNP (last 3 results)  Recent Labs  09/19/14 1950  BNP 30.1    ProBNP (last 3 results) No results for input(s): PROBNP in the last 8760 hours.  CBG: No results for input(s): GLUCAP in the last 168 hours.     SignedHartley Barefoot A  [facility] 10/16/2014, 11:57 AM

## 2014-10-16 NOTE — Progress Notes (Signed)
Report called to Occupational psychologist at [facility].

## 2014-10-16 NOTE — Progress Notes (Signed)
Pt discharged to [facility]. Transported via [facility].  VSS. Family aware and agreeable to plan of care.

## 2014-10-16 NOTE — Clinical Social Work Note (Signed)
Interpreter requested w L Best for patient as follows:  9 - 11:30 AM, 1 - 4:30 PM, 6 - 8 PM.  Interpreter also requested STAT for today until no longer needed for admission assessments.  Santa Genera, LCSW Clinical Social Worker

## 2014-10-16 NOTE — Progress Notes (Signed)
CSW received phone call from [facility] regarding patient acceptance to [facility]. Accepting MD is [place] and Attending MD is [place]. Patient room number is 405-Bed 2. Number to provide report is [phone]. CSW to meet with patient to complete voluntary consent and fax back to [facility].   Fernande Boyden, [facility] Clinical Social Worker [place] [facility] [phone]

## 2014-10-16 NOTE — Progress Notes (Signed)
D: Patient resting in bed with eyes closed.  Respirations even and unlabored.  Patient appears to be in no apparent distress. A: Staff to monitor Q 15 mins for safety.   R:Patient remains safe on the unit.  

## 2014-10-16 NOTE — Progress Notes (Signed)
CSW contacted [facility] for transport. Driver will be in route shortly.    Janann Colonel, LCSW Clinical Social Work [place]  [phone]

## 2014-10-16 NOTE — Progress Notes (Signed)
Notified daughter in law Celecia [place] of pt transfer to [facility].

## 2014-10-16 NOTE — Progress Notes (Signed)
D: Patient in the dayroom sitting quietly on approach.  Interpreter was present for assessment.  Patient states she had a so, so day.  Patient states denies SI/HI but states she hears sounds but does not state what the sounds are.  Patient states, "God will have the last say so, so I am not longer in control of that.  Patient appears flat and depressed.  Patient refused snack tonight but is drinking water. A: Staff to monitor Q 15 mins for safety.  Encouragement and support offered.  No scheduled medications administered per orders.  R: Patient remains safe on the unit.  Patient attended group tonight but when her son visited she came out to visit with him. Patient minimally interacting with peers.

## 2014-10-16 NOTE — Tx Team (Signed)
Initial Interdisciplinary Treatment Plan   PATIENT STRESSORS: Financial difficulties Health problems Marital or family conflict Medication change or noncompliance   PATIENT STRENGTHS: Motivation for treatment/growth Supportive family/friends   PROBLEM LIST: Problem List/Patient Goals Date to be addressed Date deferred Reason deferred Estimated date of resolution  "suicidal thoughts" 10/16/2014   D/c  "anxiety" 10/16/2014   D/c  "depression" 10/16/2014   D/c  "panic" 10/16/2014   D/c                                 DISCHARGE CRITERIA:  Ability to meet basic life and health needs Adequate post-discharge living arrangements Improved stabilization in mood, thinking, and/or behavior Medical problems require only outpatient monitoring Motivation to continue treatment in a less acute level of care Need for constant or close observation no longer present Reduction of life-threatening or endangering symptoms to within safe limits Safe-care adequate arrangements made Verbal commitment to aftercare and medication compliance  PRELIMINARY DISCHARGE PLAN: Attend aftercare/continuing care group Attend PHP/IOP Outpatient therapy Participate in family therapy Return to previous living arrangement  PATIENT/FAMIILY INVOLVEMENT: This treatment plan has been presented to and reviewed with the patient, Barbara Beck.  .  The patient and family have been given the opportunity to ask questions and make suggestions.  Earline Mayotte 10/16/2014, 5:58 PM

## 2014-10-16 NOTE — Progress Notes (Addendum)
1:1 Note:  1547  1:1 started for patient's safety.  MD talking to patient in her room.  Patient has been sitting in room eating lunch.  No signs/symptoms of pain/distress noted on patient's face/body movements.  Respirations even and unlabored.  1:1 for safety per MD order.  1600  Patient in wheelchair taken to dayroom to be near peers and activity.  Respirations even and unlabored.  No signs/symptoms of pain/distress noted on patient's face/body movements.  1:1 per MD order for patient's safety.  1900  Patient has 1:1 for safety.  Patient's respirations even and unlabored.  No signs/symptoms of pain/distress noted on patient's face/body movements.  Patient's daughter visited tonight and requested ensure order be placed for her mother.  Patient ate approximately 20% of dinner tonight.  Patient continues to be in wheelchair for safety and also with 1:1 present for safety.

## 2014-10-17 ENCOUNTER — Encounter (HOSPITAL_COMMUNITY): Payer: Self-pay | Admitting: Psychiatry

## 2014-10-17 DIAGNOSIS — F332 Major depressive disorder, recurrent severe without psychotic features: Principal | ICD-10-CM

## 2014-10-17 MED ORDER — ENSURE ENLIVE PO LIQD
237.0000 mL | Freq: Two times a day (BID) | ORAL | Status: DC
  Administered 2014-10-17: 237 mL via ORAL

## 2014-10-17 NOTE — Progress Notes (Signed)
D: Patient resting in bed with eyes closed.  Respirations even and unlabored.  Patient appears to be in no apparent distress. A: Staff to monitor Q 15 mins for safety.  Patient remains on 1:1 for safety. R:  Patient remains safe on the unit.  

## 2014-10-17 NOTE — [facility] Group Notes (Addendum)
[facility] Group Notes:  (Nursing/MHT/Case Management/Adjunct)  Date:  10/17/2014  Time:  0900   Type of Therapy:  Nurse Education  Participation Level:  Did Not Attend  Participation Quality:    Affect:    Cognitive:    Insight:    Engagement in Group:    Modes of Intervention:    Summary of Progress/Problems: Pt was sleeping and did not want to wake up for group. Spoke to pt with aid of interpreter. Safety sitter and interpreter present.   Maurine Simmering 10/17/2014, 9:43 AM

## 2014-10-17 NOTE — Progress Notes (Signed)
D: Patient in her room with her daughter in law and interpreter on approach.  Patient has blunted affect and depressed mood.  Patient did minimally speak with Clinical research associate through the translator.  Patient did inquire about being discharged.  Patient states she ate dinner today that her daughter in law fed her.  Patient denies SI/HI and denies AVH tonight.  Patient remains on 1:1 for fall risk safety.  A: Staff to monitor Q 15 mins for safety.  Encouragement and support offered.  Scheduled medications administered per orders. R: Patient remains safe on the unit.  Patient did not attend karaoke group tonight.  Patient not visible on the unit.  No medications to administer tonight.

## 2014-10-17 NOTE — Progress Notes (Signed)
D: Patient up and awake in her room on approach.  Patient standing in room staring at staff.  Patient was asked if she wanted food and patient did not verbally respond.  Patient did sit down on the bed after staff asked her to sit.  Patient facial expression appears angry but due to patient not communicating with staff we are unable to tell at this time.  Patient does is alert.  Staff will bring patient food that per her daughter in law she likes to eat for breakfast which is oatmeal.  Writer fixed patient coffee this morning and patient looked at it and would not take it from Clinical research associate.  Patient remains on 1:1 for fall risk safety. A: Patient remains safe on the unit.  Patient remains on 1:1. R:  Patient is safe at this time.

## 2014-10-17 NOTE — H&P (Signed)
Psychiatric Admission Assessment Adult  Patient Identification: Barbara Beck MRN: [id number] Date of Evaluation: 10/17/2014 Chief Complaint: MDD Principal Diagnosis: Severe recurrent major depression without psychotic features  Diagnosis:  Patient Active Problem List   Diagnosis Date Noted  . Severe recurrent major depression without psychotic features 10/08/2014    Priority: High  . MDD (major depressive disorder), recurrent severe, without psychosis 10/11/2014  . Dehydration   . Thrush   . Syncope and collapse 10/09/2014  . Syncope 10/09/2014  . Metabolic encephalopathy 79/05/3974  . UTI (urinary tract infection) 10/09/2014  . Depression 10/07/2014  . Poor dentition 08/01/2014  . Decreased visual acuity 08/01/2014  . Sinus bradycardia 04/12/2014  . Chronic diarrhea 03/19/2014  . Bradycardia 02/19/2014  . Cataract 02/19/2014  . Bartholin gland cyst 02/19/2014  . Right sided abdominal pain 01/29/2014  . Allergic rhinoconjunctivitis of both eyes 01/29/2014  . Vitamin D deficiency 01/29/2014  . SOB (shortness of breath) 01/29/2014  . Tingling 01/29/2014  . Spanish speaking patient 01/29/2014  . Osteoarthritis of left hip 01/29/2014  . DJD (degenerative joint disease), lumbar 01/29/2014  . Umbilical hernia 79/41/9379  . Hypertension   . GERD (gastroesophageal reflux disease)     History of Present Illness::  Patient's second admission to [facility], voluntary. Patient denied SI and HI, contracts for safety. Stated she does not hear voices but does hear noises. Head feels like it is going in circles. Denied visual hallucinations. When at home, patient stated she does not want to bathe or dress. Language barrier causes her to feel depressed and "very small, low self esteem". Started losing weight in December 2015 and weighed approximately 140 lb in Dec. Started losing weight, intestional disorder, which caused her to lose weight, dairrhea. Stopped eating as much due to  frequent diarrhea. Stated she also got dentures approximately one month which are uncomfortable and she lost more weight. Lost appetite from the depression. Rated depression 10, denied anxiety, hopeless #10. Denied alcohol, THC, cigarettes, and all drugs. Patient stated she sees her regular MD every 3 months, hospitalized 3 times at regular hospital in past year and two hospitalizations at [facility] in the past yr. Dentures loose, R eye decreased vision, hard of hearing. Son and his wife brought patient to [place] several months ago and misses her friends, family, and several funerals. Stated her legs feel week. Note in chart stated patient's son committed suicide in October 2014. Skin, no scars, no surgeries. Does not eat meat, eggs. Children 2 girls and 2 boys. Husband deceased. Patient stated she has low self esteem, does not want to dress/bathe, decreased appetite, does not enjoy eating food. Lives with her daughter Early Chars. Family takes her to MD appointments.   Pt seen and chart reviewed for H&P on 10/17/14. Pt was here recently from 10/08/14 to 09/21, then went to the medical floor and was discharged on 10/16/14. Family was very concerned about her depression and refusal to eat so they brought her here. Pt reports that she is depressed and anxious but denies suicidal/homicidal ideation. Pt spoke to me in detail on the evening of 09/28, but today, she is not speaking hardly at all. She then refused to answer my questions and the nursing staff report that she is not eating or drinking, nor using medication as prescribed. Pt did complain, upon arrival, of pain in her mouth with her dentures and that this was affecting her somewhat. From our conversation upon arrival, family collateral, nursing staff objective data, and other  findings, we do not believe she is suicidal/homicidal or psychotic and she does not appear to be responding to internal stimuli.  Considering the above  information, pt may be suitable to discharge in the next 24-48 hours as prolonged admission may exacerbate her stressors which include her self-reported concerns about "not being around family and friends" and "language problems" and "missing home" ([place]      Elements: Location: Depression. Quality: building up to wanting to die. Severity: severe. Timing: every day. Duration: worst in the last couple of weeks. Context: in [place] for the last 9 months unable to adjust having a hard time wiht the language barrier the depression getting worst, wants to kill herself. Associated Signs/Symptoms: Depression Symptoms: anhedonia, insomnia, psychomotor retardation, fatigue, feelings of worthlessness/guilt, difficulty concentrating, hopelessness, loss of energy/fatigue, disturbed sleep, weight loss, increased appetite, (Hypo) Manic Symptoms: denies Anxiety Symptoms: denies Psychotic Symptoms: denies PTSD Symptoms: Negative Total Time spent with patient: 45 minutes  Past Medical History:  Past Medical History  Diagnosis Date  . Hypertension ?   Marland Kitchen GERD (gastroesophageal reflux disease) ?   Marland Kitchen Suicidal ideation     Past Surgical History  Procedure Laterality Date  . Eye surgery Left     Cataract Surgery    Family History:  Family History  Problem Relation Age of Onset  . Cancer Daughter     breast with bone mets   . Hypertension Father   a son committed suicide Social History:  History  Alcohol Use No    History  Drug Use No    Social History   Social History  . Marital Status: Widowed    Spouse Name: N/A  . Number of Children: 5  . Years of Education: 0   Occupational History  . Retired     Social History Main Topics  . Smoking status: Former Smoker -- 2.00 packs/day for 13 years    Quit date: 01/19/1984  . Smokeless tobacco: Never Used  .  Alcohol Use: No  . Drug Use: No  . Sexual Activity: No   Other Topics Concern  . None   Social History Narrative   From [place]   Moved to [place] in 2014, back to [place], back to [place] 01/18/2014.    Did not attend school.    Has 4 living children. Had 5 total, one died at 45 months old.    Lives with son and daughter-in-law in [place].   Has a daughter, Conception Oms, who is in the [place].   81 Y/o female from [place] living with her son and daughter in Sports [place] in [place]. She is a widow and had 5 children. She has been out of the [place] fo almost 2 years one in [place] with a son there and now in [place] Additional Social History:   History of alcohol / drug use?: No history of alcohol / drug abuse                     Musculoskeletal: Strength & Muscle Tone: within normal limits Gait & Station: normal Patient leans: normal  Psychiatric Specialty Exam: Physical Exam  Review of Systems  Constitutional: Positive for malaise/fatigue.  HENT: Negative.  Eyes: Negative.  Respiratory: Negative.  Cardiovascular: Negative.  Gastrointestinal: Positive for diarrhea.  Genitourinary: Negative.  Musculoskeletal: Negative.  Skin: Negative.  Neurological: Positive for weakness.  Endo/Heme/Allergies: Negative.  Psychiatric/Behavioral: Positive for depression and he patient is nervous/anxious.    BP 167/75 mmHg  Pulse 97  Temp(Src) 98.6 F (37 C) (Oral)  Resp 16  Ht 4' 9"  (1.448 m)  Wt 54.885 kg (121 lb)  BMI 26.18 kg/m2  SpO2 99%   General Appearance: Fairly Groomed  Engineer, water:: Fair  Speech: Clear and Coherent and Slow  Volume: Decreased  Mood: Depressed  Affect: Depressed and Restricted  Thought Process: Coherent and Goal Directed  Orientation: Full (Time, Place, and Person)  Thought Content: symptoms events worries concerns  Suicidal Thoughts: No  Homicidal Thoughts: No  Memory:  Immediate; Fair Recent; Fair Remote; Fair  Judgement: Fair  Insight: Present  Psychomotor Activity: Decreased  Concentration: Fair  Recall: [employer] of Knowledge:Fair  Language: Fair  Akathisia: No  Handed: Right  AIMS (if indicated):    Assets: Housing Social Support  ADL's: Intact  Cognition: WNL  Sleep:     Risk to Self: Is patient at risk for suicide?: NO Risk to Others:   Prior Inpatient Therapy:  Denies Prior Outpatient Therapy:  Denies  Alcohol Screening: Patient refused Alcohol Screening Tool: Yes 1. How often do you have a drink containing alcohol?: Never  Allergies:  Allergies  Allergen Reactions  . Aspirin Hives and Nausea Only    NOTHING THAT CONTAINS ASPIRIN   Lab Results:    Lab Results Last 48 Hours    No results found for this or any previous visit (from the past 48 hour(s)).    Current facility-administered medications:  .  acetaminophen (TYLENOL) tablet 650 mg, 650 mg, Oral, Q6H PRN, Patrecia Pour, NP .  alum & mag hydroxide-simeth (MAALOX/MYLANTA) 200-200-20 MG/5ML suspension 30 mL, 30 mL, Oral, Q4H PRN, Patrecia Pour, NP .  amLODipine (NORVASC) tablet 10 mg, 10 mg, Oral, Daily, Patrecia Pour, NP, 10 mg at 10/17/14 1804 .  calcium-vitamin D (OSCAL WITH D) 500-200 MG-UNIT per tablet 1 tablet, 1 tablet, Oral, Daily, Patrecia Pour, NP, 1 tablet at 10/17/14 1804 .  feeding supplement (ENSURE ENLIVE) (ENSURE ENLIVE) liquid 237 mL, 237 mL, Oral, BID BM, Myer Peer Cobos, MD, 237 mL at 10/17/14 1400 .  fluticasone (FLONASE) 50 MCG/ACT nasal spray 2 spray, 2 spray, Each Nare, Daily, Patrecia Pour, NP, 2 spray at 10/17/14 1805 .  magnesium hydroxide (MILK OF MAGNESIA) suspension 30 mL, 30 mL, Oral, Daily PRN, Patrecia Pour, NP .  sertraline (ZOLOFT) tablet 25 mg, 25 mg, Oral, Daily, Patrecia Pour, NP, 25 mg at 10/17/14 1805  Medications Prior to Admission  Medication Sig Dispense Refill  . acetaminophen  (TYLENOL) 325 MG tablet Take 2 tablets (650 mg total) by mouth 3 (three) times daily as needed for moderate pain. 50 tablet 1  . amLODipine (NORVASC) 10 MG tablet Take 1 tablet (10 mg total) by mouth daily. 90 tablet 3  . Calcium Carb-Cholecalciferol (CALCIUM-VITAMIN D) 600-400 MG-UNIT TABS Take 1 tablet by mouth daily.     . cyanocobalamin 500 MCG tablet Take 1 tablet (500 mcg total) by mouth daily. 90 tablet 3  . feeding supplement, ENSURE ENLIVE, (ENSURE ENLIVE) LIQD Take 237 mLs by mouth 3 (three) times daily between meals. 237 mL 12  . fluticasone (FLONASE) 50 MCG/ACT nasal spray Place 2 sprays into both nostrils daily. 16 g 6  . nystatin (MYCOSTATIN) 100000 UNIT/ML suspension Take 5 mLs (500,000 Units total) by mouth 4 (four) times daily. 60 mL 0  . pyridOXINE (B-6) 50 MG tablet Take 1 tablet (50 mg total) by mouth daily. 90 tablet 1  . ranitidine (  ZANTAC) 150 MG tablet TAKE 1 TABLET BY MOUTH 2 TIMES DAILY (Patient taking differently: TAKE 1 TABLET BY MOUTH AT BEDTIME) 60 tablet 2  . sertraline (ZOLOFT) 25 MG tablet Take 1 tablet (25 mg total) by mouth daily. 30 tablet 0  . Thiamine 50 MG CAPS Take 1 capsule (50 mg total) by mouth daily. 90 capsule 3      Previous Psychotropic Medications: No   Substance Abuse History in the last 12 months: No.    Consequences of Substance Abuse: Negative Recent Results (from the past 2160 hour(s))  Basic Metabolic Panel     Status: None   Collection Time: 08/01/14  4:38 PM  Result Value Ref Range   Sodium 145 135 - 145 mEq/L   Potassium 5.3 3.5 - 5.3 mEq/L   Chloride 101 96 - 112 mEq/L   CO2 30 19 - 32 mEq/L   Glucose, Bld 82 70 - 99 mg/dL   BUN 10 6 - 23 mg/dL   Creat 0.77 0.50 - 1.10 mg/dL   Calcium 9.7 8.4 - 10.5 mg/dL  CBG monitoring, ED     Status: None   Collection Time: 09/19/14  7:47 PM  Result Value Ref Range   Glucose-Capillary 99 65 - 99 mg/dL   Comment 1 Notify RN    Comment 2 Document in Chart   Lipase, blood     Status:  Abnormal   Collection Time: 09/19/14  7:50 PM  Result Value Ref Range   Lipase 20 (L) 22 - 51 U/L  Comprehensive metabolic panel     Status: Abnormal   Collection Time: 09/19/14  7:50 PM  Result Value Ref Range   Sodium 138 135 - 145 mmol/L   Potassium 3.6 3.5 - 5.1 mmol/L   Chloride 101 101 - 111 mmol/L   CO2 31 22 - 32 mmol/L   Glucose, Bld 106 (H) 65 - 99 mg/dL   BUN 7 6 - 20 mg/dL   Creatinine, Ser 0.80 0.44 - 1.00 mg/dL   Calcium 8.9 8.9 - 10.3 mg/dL   Total Protein 6.8 6.5 - 8.1 g/dL   Albumin 3.5 3.5 - 5.0 g/dL   AST 23 15 - 41 U/L   ALT 17 14 - 54 U/L   Alkaline Phosphatase 91 38 - 126 U/L   Total Bilirubin 0.6 0.3 - 1.2 mg/dL   GFR calc non Af Amer >60 >60 mL/min   GFR calc Af Amer >60 >60 mL/min    Comment: (NOTE) The eGFR has been calculated using the CKD EPI equation. This calculation has not been validated in all clinical situations. eGFR's persistently <60 mL/min signify possible Chronic Kidney Disease.    Anion gap 6 5 - 15  CBC     Status: None   Collection Time: 09/19/14  7:50 PM  Result Value Ref Range   WBC 8.9 4.0 - 10.5 K/uL   RBC 4.74 3.87 - 5.11 MIL/uL   Hemoglobin 14.0 12.0 - 15.0 g/dL   HCT 42.0 36.0 - 46.0 %   MCV 88.6 78.0 - 100.0 fL   MCH 29.5 26.0 - 34.0 pg   MCHC 33.3 30.0 - 36.0 g/dL   RDW 12.8 11.5 - 15.5 %   Platelets 331 150 - 400 K/uL  Brain natriuretic peptide     Status: None   Collection Time: 09/19/14  7:50 PM  Result Value Ref Range   B Natriuretic Peptide 30.1 0.0 - 100.0 pg/mL  Urinalysis, Routine w reflex microscopic (not at  [facility])     Status: Abnormal   Collection Time: 09/19/14  7:52 PM  Result Value Ref Range   Color, Urine YELLOW YELLOW   APPearance CLOUDY (A) CLEAR   Specific Gravity, Urine 1.012 1.005 - 1.030   pH 7.0 5.0 - 8.0   Glucose, UA NEGATIVE NEGATIVE mg/dL   Hgb urine dipstick NEGATIVE NEGATIVE   Bilirubin Urine NEGATIVE NEGATIVE   Ketones, ur NEGATIVE NEGATIVE mg/dL   Protein, ur NEGATIVE NEGATIVE mg/dL    Urobilinogen, UA 1.0 0.0 - 1.0 mg/dL   Nitrite NEGATIVE NEGATIVE   Leukocytes, UA MODERATE (A) NEGATIVE  Urine microscopic-add on     Status: Abnormal   Collection Time: 09/19/14  7:52 PM  Result Value Ref Range   Squamous Epithelial / LPF MANY (A) RARE   WBC, UA 11-20 <3 WBC/hpf   Bacteria, UA MANY (A) RARE  Urine rapid drug screen (hosp performed) (Not at [facility])     Status: None   Collection Time: 10/05/14  4:30 PM  Result Value Ref Range   Opiates NONE DETECTED NONE DETECTED   Cocaine NONE DETECTED NONE DETECTED   Benzodiazepines NONE DETECTED NONE DETECTED   Amphetamines NONE DETECTED NONE DETECTED   Tetrahydrocannabinol NONE DETECTED NONE DETECTED   Barbiturates NONE DETECTED NONE DETECTED    Comment:        DRUG SCREEN FOR MEDICAL PURPOSES ONLY.  IF CONFIRMATION IS NEEDED FOR ANY PURPOSE, NOTIFY LAB WITHIN 5 DAYS.        LOWEST DETECTABLE LIMITS FOR URINE DRUG SCREEN Drug Class       Cutoff (ng/mL) Amphetamine      1000 Barbiturate      200 Benzodiazepine   701 Tricyclics       410 Opiates          300 Cocaine          300 THC              50   Comprehensive metabolic panel     Status: Abnormal   Collection Time: 10/05/14  4:50 PM  Result Value Ref Range   Sodium 130 (L) 135 - 145 mmol/L   Potassium 3.7 3.5 - 5.1 mmol/L   Chloride 94 (L) 101 - 111 mmol/L   CO2 27 22 - 32 mmol/L   Glucose, Bld 143 (H) 65 - 99 mg/dL   BUN 11 6 - 20 mg/dL   Creatinine, Ser 0.80 0.44 - 1.00 mg/dL   Calcium 9.0 8.9 - 10.3 mg/dL   Total Protein 7.0 6.5 - 8.1 g/dL   Albumin 3.4 (L) 3.5 - 5.0 g/dL   AST 28 15 - 41 U/L   ALT 19 14 - 54 U/L   Alkaline Phosphatase 92 38 - 126 U/L   Total Bilirubin 0.6 0.3 - 1.2 mg/dL   GFR calc non Af Amer >60 >60 mL/min   GFR calc Af Amer >60 >60 mL/min    Comment: (NOTE) The eGFR has been calculated using the CKD EPI equation. This calculation has not been validated in all clinical situations. eGFR's persistently <60 mL/min signify possible  Chronic Kidney Disease.    Anion gap 9 5 - 15  Ethanol (ETOH)     Status: None   Collection Time: 10/05/14  4:50 PM  Result Value Ref Range   Alcohol, Ethyl (B) <5 <5 mg/dL    Comment:        LOWEST DETECTABLE LIMIT FOR SERUM ALCOHOL IS 5 mg/dL FOR MEDICAL PURPOSES ONLY  Salicylate level     Status: None   Collection Time: 10/05/14  4:50 PM  Result Value Ref Range   Salicylate Lvl <1.0 2.8 - 30.0 mg/dL  Acetaminophen level     Status: Abnormal   Collection Time: 10/05/14  4:50 PM  Result Value Ref Range   Acetaminophen (Tylenol), Serum <10 (L) 10 - 30 ug/mL    Comment:        THERAPEUTIC CONCENTRATIONS VARY SIGNIFICANTLY. A RANGE OF 10-30 ug/mL MAY BE AN EFFECTIVE CONCENTRATION FOR MANY PATIENTS. HOWEVER, SOME ARE BEST TREATED AT CONCENTRATIONS OUTSIDE THIS RANGE. ACETAMINOPHEN CONCENTRATIONS >150 ug/mL AT 4 HOURS AFTER INGESTION AND >50 ug/mL AT 12 HOURS AFTER INGESTION ARE OFTEN ASSOCIATED WITH TOXIC REACTIONS.   CBC     Status: None   Collection Time: 10/05/14  4:50 PM  Result Value Ref Range   WBC 9.5 4.0 - 10.5 K/uL   RBC 4.84 3.87 - 5.11 MIL/uL   Hemoglobin 14.3 12.0 - 15.0 g/dL   HCT 42.1 36.0 - 46.0 %   MCV 87.0 78.0 - 100.0 fL   MCH 29.5 26.0 - 34.0 pg   MCHC 34.0 30.0 - 36.0 g/dL   RDW 12.9 11.5 - 15.5 %   Platelets 330 150 - 400 K/uL  Urinalysis, Routine w reflex microscopic (not at [facility])     Status: Abnormal   Collection Time: 10/05/14  4:50 PM  Result Value Ref Range   Color, Urine YELLOW YELLOW   APPearance CLOUDY (A) CLEAR   Specific Gravity, Urine 1.017 1.005 - 1.030   pH 7.0 5.0 - 8.0   Glucose, UA NEGATIVE NEGATIVE mg/dL   Hgb urine dipstick NEGATIVE NEGATIVE   Bilirubin Urine NEGATIVE NEGATIVE   Ketones, ur 15 (A) NEGATIVE mg/dL   Protein, ur NEGATIVE NEGATIVE mg/dL   Urobilinogen, UA 1.0 0.0 - 1.0 mg/dL   Nitrite NEGATIVE NEGATIVE   Leukocytes, UA LARGE (A) NEGATIVE  Urine microscopic-add on     Status: Abnormal   Collection Time:  10/05/14  4:50 PM  Result Value Ref Range   Squamous Epithelial / LPF MANY (A) RARE   WBC, UA 11-20 <3 WBC/hpf   RBC / HPF 0-2 <3 RBC/hpf   Bacteria, UA RARE RARE   Casts HYALINE CASTS (A) NEGATIVE   Urine-Other MUCOUS PRESENT   Glucose, capillary     Status: Abnormal   Collection Time: 10/09/14 10:03 AM  Result Value Ref Range   Glucose-Capillary 122 (H) 65 - 99 mg/dL   Comment 1 Notify RN    Comment 2 Document in Chart   Comprehensive metabolic panel     Status: Abnormal   Collection Time: 10/09/14 11:07 AM  Result Value Ref Range   Sodium 133 (L) 135 - 145 mmol/L   Potassium 3.6 3.5 - 5.1 mmol/L   Chloride 95 (L) 101 - 111 mmol/L   CO2 28 22 - 32 mmol/L   Glucose, Bld 113 (H) 65 - 99 mg/dL   BUN 14 6 - 20 mg/dL   Creatinine, Ser 0.60 0.44 - 1.00 mg/dL   Calcium 9.0 8.9 - 10.3 mg/dL   Total Protein 7.4 6.5 - 8.1 g/dL   Albumin 3.6 3.5 - 5.0 g/dL   AST 62 (H) 15 - 41 U/L   ALT 59 (H) 14 - 54 U/L   Alkaline Phosphatase 89 38 - 126 U/L   Total Bilirubin 0.6 0.3 - 1.2 mg/dL   GFR calc non Af Amer >60 >60 mL/min   GFR calc  Af Amer >60 >60 mL/min    Comment: (NOTE) The eGFR has been calculated using the CKD EPI equation. This calculation has not been validated in all clinical situations. eGFR's persistently <60 mL/min signify possible Chronic Kidney Disease.    Anion gap 10 5 - 15  CBC     Status: None   Collection Time: 10/09/14 11:07 AM  Result Value Ref Range   WBC 8.8 4.0 - 10.5 K/uL   RBC 5.02 3.87 - 5.11 MIL/uL   Hemoglobin 14.9 12.0 - 15.0 g/dL   HCT 43.2 36.0 - 46.0 %   MCV 86.1 78.0 - 100.0 fL   MCH 29.7 26.0 - 34.0 pg   MCHC 34.5 30.0 - 36.0 g/dL   RDW 12.7 11.5 - 15.5 %   Platelets 318 150 - 400 K/uL  CBG monitoring, ED     Status: Abnormal   Collection Time: 10/09/14 11:20 AM  Result Value Ref Range   Glucose-Capillary 102 (H) 65 - 99 mg/dL  Protime-INR     Status: None   Collection Time: 10/09/14 12:05 PM  Result Value Ref Range   Prothrombin Time  13.9 11.6 - 15.2 seconds   INR 1.05 0.00 - 1.49  I-stat troponin, ED     Status: None   Collection Time: 10/09/14 12:09 PM  Result Value Ref Range   Troponin i, poc 0.01 0.00 - 0.08 ng/mL   Comment 3            Comment: Due to the release kinetics of cTnI, a negative result within the first hours of the onset of symptoms does not rule out myocardial infarction with certainty. If myocardial infarction is still suspected, repeat the test at appropriate intervals.   Magnesium     Status: None   Collection Time: 10/09/14  1:53 PM  Result Value Ref Range   Magnesium 1.9 1.7 - 2.4 mg/dL  TSH     Status: None   Collection Time: 10/09/14  1:53 PM  Result Value Ref Range   TSH 0.831 0.350 - 4.500 uIU/mL  Troponin I     Status: None   Collection Time: 10/09/14  1:53 PM  Result Value Ref Range   Troponin I <0.03 <0.031 ng/mL    Comment:        NO INDICATION OF MYOCARDIAL INJURY.   Urine culture     Status: None   Collection Time: 10/09/14  7:46 PM  Result Value Ref Range   Specimen Description URINE, RANDOM    Special Requests NONE    Culture      MULTIPLE SPECIES PRESENT, SUGGEST RECOLLECTION Performed at [facility]    Report Status 10/11/2014 FINAL   CBC     Status: None   Collection Time: 10/09/14  8:20 PM  Result Value Ref Range   WBC 8.6 4.0 - 10.5 K/uL   RBC 4.65 3.87 - 5.11 MIL/uL   Hemoglobin 13.8 12.0 - 15.0 g/dL   HCT 40.0 36.0 - 46.0 %   MCV 86.0 78.0 - 100.0 fL   MCH 29.7 26.0 - 34.0 pg   MCHC 34.5 30.0 - 36.0 g/dL   RDW 12.9 11.5 - 15.5 %   Platelets 309 150 - 400 K/uL  Creatinine, serum     Status: None   Collection Time: 10/09/14  8:20 PM  Result Value Ref Range   Creatinine, Ser 0.57 0.44 - 1.00 mg/dL   GFR calc non Af Amer >60 >60 mL/min   GFR calc Af  Amer >60 >60 mL/min    Comment: (NOTE) The eGFR has been calculated using the CKD EPI equation. This calculation has not been validated in all clinical situations. eGFR's persistently <60  mL/min signify possible Chronic Kidney Disease.   Troponin I     Status: None   Collection Time: 10/09/14  8:20 PM  Result Value Ref Range   Troponin I <0.03 <0.031 ng/mL    Comment:        NO INDICATION OF MYOCARDIAL INJURY.   Troponin I     Status: None   Collection Time: 10/10/14  2:00 AM  Result Value Ref Range   Troponin I <0.03 <0.031 ng/mL    Comment:        NO INDICATION OF MYOCARDIAL INJURY.   Comprehensive metabolic panel     Status: Abnormal   Collection Time: 10/10/14  2:00 AM  Result Value Ref Range   Sodium 136 135 - 145 mmol/L   Potassium 3.5 3.5 - 5.1 mmol/L   Chloride 103 101 - 111 mmol/L   CO2 27 22 - 32 mmol/L   Glucose, Bld 113 (H) 65 - 99 mg/dL   BUN 11 6 - 20 mg/dL   Creatinine, Ser 0.70 0.44 - 1.00 mg/dL   Calcium 8.6 (L) 8.9 - 10.3 mg/dL   Total Protein 6.6 6.5 - 8.1 g/dL   Albumin 3.1 (L) 3.5 - 5.0 g/dL   AST 36 15 - 41 U/L   ALT 41 14 - 54 U/L   Alkaline Phosphatase 81 38 - 126 U/L   Total Bilirubin 0.7 0.3 - 1.2 mg/dL   GFR calc non Af Amer >60 >60 mL/min   GFR calc Af Amer >60 >60 mL/min    Comment: (NOTE) The eGFR has been calculated using the CKD EPI equation. This calculation has not been validated in all clinical situations. eGFR's persistently <60 mL/min signify possible Chronic Kidney Disease.    Anion gap 6 5 - 15  CBC     Status: None   Collection Time: 10/10/14  2:00 AM  Result Value Ref Range   WBC 8.1 4.0 - 10.5 K/uL   RBC 4.64 3.87 - 5.11 MIL/uL   Hemoglobin 13.9 12.0 - 15.0 g/dL   HCT 40.1 36.0 - 46.0 %   MCV 86.4 78.0 - 100.0 fL   MCH 30.0 26.0 - 34.0 pg   MCHC 34.7 30.0 - 36.0 g/dL   RDW 13.0 11.5 - 15.5 %   Platelets 310 150 - 400 K/uL  Basic metabolic panel     Status: Abnormal   Collection Time: 10/13/14  4:17 PM  Result Value Ref Range   Sodium 135 135 - 145 mmol/L   Potassium 3.4 (L) 3.5 - 5.1 mmol/L   Chloride 101 101 - 111 mmol/L   CO2 27 22 - 32 mmol/L   Glucose, Bld 171 (H) 65 - 99 mg/dL   BUN 10 6 -  20 mg/dL   Creatinine, Ser 0.69 0.44 - 1.00 mg/dL   Calcium 8.3 (L) 8.9 - 10.3 mg/dL   GFR calc non Af Amer >60 >60 mL/min   GFR calc Af Amer >60 >60 mL/min    Comment: (NOTE) The eGFR has been calculated using the CKD EPI equation. This calculation has not been validated in all clinical situations. eGFR's persistently <60 mL/min signify possible Chronic Kidney Disease.    Anion gap 7 5 - 15     Lab Results Past 72 Hours    No results found  for this or any previous visit (from the past 49 hour(s)).  .lab  Observation Level/Precautions: 15 minute checks  Laboratory: As per the ED and TSH  Psychotherapy: Individual/group  Medications: Will start a trial with Zoloft 25 mg daily  Consultations:   Discharge Concerns:   Estimated LOS: 2-3 days  Other:    Psychological Evaluations: No   Treatment Plan Summary: Daily contact with patient to assess and evaluate symptoms and progress in treatment and Medication management Supportive approach/coping skills  Depression; will restart Zoloft 25 mg daily and reassess tolerability  Will facilitate offering foods that she can handle given her dentures problems  Will monitor her BP  Use CBT/mindfulness/prayer   Medical Decision Making: Review of Psycho-Social Stressors (1), Review or order clinical lab tests (1) and Review of Medication Regimen & Side Effects (2)  I certify that inpatient services furnished can reasonably be expected to improve the patient's condition.  Benjamine Mola, FNP 10/17/2014  10:39 AM     I personally assessed the patient, reviewed the physical exam and labs and formulated the treatment plan Geralyn Flash A. Sabra Heck, M.D.

## 2014-10-17 NOTE — Progress Notes (Signed)
1:1 note Pt remains safe with safety sitter present. See just-filed note for further details. Interpreter present as well.

## 2014-10-17 NOTE — BHH Suicide Risk Assessment (Signed)
[facility] Admission Suicide Risk Assessment   Nursing information obtained from:  Patient Demographic factors:  Age 79 or older, Low socioeconomic status, Unemployed Current Mental Status:  NA Loss Factors:  Financial problems / change in socioeconomic status Historical Factors:  NA Risk Reduction Factors:  Sense of responsibility to family, Living with another person, especially a relative, Positive social support Total Time spent with patient: 45 minutes Principal Problem: <principal problem not specified> Diagnosis:   Patient Active Problem List   Diagnosis Date Noted  . Major depressive disorder, recurrent episode, severe [F33.2] 10/16/2014  . MDD (major depressive disorder), recurrent severe, without psychosis [F33.2] 10/11/2014  . Dehydration [E86.0]   . Thrush [B37.0]   . Syncope and collapse [R55] 10/09/2014  . Syncope [R55] 10/09/2014  . Metabolic encephalopathy [G93.41] 10/09/2014  . UTI (urinary tract infection) [N39.0] 10/09/2014  . 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 Depression [F32.9] 10/07/2014  . Poor dentition [K08.8] 08/01/2014  . Decreased visual acuity [H54.7] 08/01/2014  . Sinus bradycardia [R00.1] 04/12/2014  . Chronic diarrhea [K52.9] 03/19/2014  . Bradycardia [R00.1] 02/19/2014  . Cataract [H26.9] 02/19/2014  . Bartholin gland cyst [N75.0] 02/19/2014  . Right sided abdominal pain [R10.9] 01/29/2014  . Allergic rhinoconjunctivitis of both eyes [J30.9, H10.13] 01/29/2014  . Vitamin D deficiency [E55.9] 01/29/2014  . SOB (shortness of breath) [R06.02] 01/29/2014  . Tingling [R20.2] 01/29/2014  . Spanish speaking patient [R47.89] 01/29/2014  . Osteoarthritis of left hip [M16.12] 01/29/2014  . DJD (degenerative joint disease), lumbar [M47.816] 01/29/2014  . Umbilical hernia [K42.9] 01/29/2014  . Hypertension [I10]   . GERD (gastroesophageal reflux disease) [K21.9]      Continued Clinical Symptoms:  Alcohol Use  Disorder Identification Test Final Score (AUDIT): 0 The "Alcohol Use Disorders Identification Test", Guidelines for Use in Primary Care, Second Edition.  World Science writer [facility]). Score between 0-7:  no or low risk or alcohol related problems. Score between 8-15:  moderate risk of alcohol related problems. Score between 16-19:  high risk of alcohol related problems. Score 20 or above:  warrants further diagnostic evaluation for alcohol dependence and treatment.   CLINICAL FACTORS:   Depression:   Severe   Musculoskeletal: Strength & Muscle Tone: decreased Gait & Station: unsteady Patient leans: in a wheel chair  Psychiatric Specialty Exam: Physical Exam  Review of Systems  Constitutional: Positive for malaise/fatigue.  HENT: Negative.   Eyes: Negative.   Respiratory: Negative.   Cardiovascular: Negative.   Gastrointestinal: Positive for diarrhea.  Genitourinary: Negative.   Musculoskeletal: Negative.   Skin: Negative.   Neurological: Positive for weakness.  Endo/Heme/Allergies: Negative.   Psychiatric/Behavioral: Positive for depression and suicidal ideas. The patient is nervous/anxious.     Blood pressure 139/63, pulse 71, temperature 98.6 F (37 C), temperature source Oral, resp. rate 16, height  (1.448 m), weight 54.885 kg (121 lb), SpO2 99 %.Body mass index is 26.18 kg/(m^2).  General Appearance: Disheveled  Eye Contact::  Minimal  Speech:  not communicating   Volume:  Decreased  Mood:  Depressed  Affect:  Depressed and Restricted  Thought Process:  answers some questions  Orientation:  Other:  person place  Thought Content:  no spontaneous content  Suicidal Thoughts:  No  Homicidal Thoughts:  No  Memory:  Fluctuates   Judgement:  Impaired  Insight:  Lacking  Psychomotor Activity:  Decreased  Concentration:  Poor  Recall:  Poor  Fund of Knowledge:Poor  Language: Poor  Akathisia:  No  Handed:  Right  AIMS (if indicated):     Assets:  Desire for  Improvement Housing Social Support  Sleep:  Number of Hours: 7  Cognition: WNL  ADL's:  Impaired     COGNITIVE FEATURES THAT CONTRIBUTE TO RISK:  Closed-mindedness, Polarized thinking and Thought constriction (tunnel vision)    SUICIDE RISK:   Minimal: No identifiable suicidal ideation.  Patients presenting with no risk factors but with morbid ruminations; may be classified as minimal risk based on the severity of the depressive symptoms  PLAN OF CARE: supportive approach 81 Y/O Hispanic female initially admitted to our service due to severe depression and suicidal ideas. She had been in [place] for 9 months. She was staying in [place] before being brought here. Prior to [place] she was in the [place]. In [place] she has gotten increasingly more depressed with SI. She states that the language barrier was one of the main reasons she got this bad. Stated that she was [facility] a [facility] and that she was the only spanish speaking person other than a [place] lady who spoke [place] and communicated in [place]. She stated that her self esteem started going down as she would just ascent with her head or smile without understanding what they were saying. She quit doing her sewing and quit praying and reading the Bible. She admitted that she had an episode of depression that lasted 7 years that she was able to overcome with prayer. States that now she cant even pray. She had issues with being able to eat, due to her dentures. She  While in the unit the first time she had an episode of syncope and was taken to the ED. She was admitted for observation in the medical unit. She was D/C to come back to [id number]. Less than 24 hours ago when she was brought back she was able to talk with this MD in her native Spanish at length. She basically retold the story. She was alert cooperative ate a sandwich. Thi AM she was almost uncommunicative with staff. She responded to this MD but in monosyllables  with no spontaneous interactions. The night before she interacted well with family members. She required help to get up and felt she was not helping herself to get up if anything putting resistance.  Depression: we are going to pursue the Zoloft further.  I Discussed the above with her daughter in law. We are going to keep her until Saturday AM so the family can make arrangements. They are planning to get her to see a Spanish speaking therapist. She is also going to go back to the [employer] program. Catering manager spoke with her daughter in law and they are going to make some changes to accommodate her needs Meanwhile we are going to continue to work to engage Medical Decision Making:  Review of Psycho-Social Stressors (1), Review or order clinical lab tests (1) and Review of Medication Regimen & Side Effects (2)  I certify that inpatient services furnished can reasonably be expected to improve the patient's condition.   LUGO,IRVING A 10/17/2014, 5:58 PM

## 2014-10-17 NOTE — Progress Notes (Signed)
NUTRITION ASSESSMENT  Pt identified as at risk on the Malnutrition Screen Tool  INTERVENTION: 1. Educated patient on the importance of nutrition and encouraged intake of food and beverages. 2. Discussed weight goals. 3. Supplements: continue Ensure Enlive po BID, each supplement provides 350 kcal and 20 grams of protein  NUTRITION DIAGNOSIS: Inadequate intakes related poor appetite as evidenced by chart review.  Goal: Pt to meet >/= 90% of their estimated nutrition needs.  Monitor:  PO intake  Assessment:  Pt seen for MST. Per notes, pt is Spanish-speaking only. She has not been eating or drinking well recently and almost passed out. Pt was recently at [facility], earlier this month.  Per chart review, pt has gained 4 lbs in the past 7 days; question accuracy based on above noted statement of poor intakes. Prior to that, pt had lost 3 lbs (2.5% body weight) in 4 days which is significant for time frame.  Ensure Enlive has been ordered BID.  81 y.o. female  Height: Ht Readings from Last 1 Encounters:  10/16/14  (1.448 m)    Weight: Wt Readings from Last 1 Encounters:  10/16/14 121 lb (54.885 kg)    Weight Hx: Wt Readings from Last 10 Encounters:  10/16/14 121 lb (54.885 kg)  10/09/14 117 lb 11.6 oz (53.4 kg)  10/07/14 119 lb 0.8 oz (54 kg)  10/09/14 119 lb (53.978 kg)  10/09/14 119 lb (53.978 kg)  10/05/14 120 lb (54.432 kg)  09/19/14 124 lb (56.246 kg)  08/01/14 124 lb (56.246 kg)  07/10/14 126 lb 1.9 oz (57.208 kg)  04/12/14 128 lb 12.8 oz (58.423 kg)    BMI:  Body mass index is 26.18 kg/(m^2). Pt meets criteria for over weight based on current BMI.  Estimated Nutritional Needs: Kcal: 25-30 kcal/kg Protein: > 1 gram protein/kg Fluid: 1 ml/kcal  Diet Order: Diet Heart Room service appropriate?: Yes; Fluid consistency:: Thin Pt is also offered choice of unit snacks mid-morning and mid-afternoon.  Pt is eating as desired.   Lab results and medications  reviewed.      Trenton Gammon, RD, LDN Inpatient Clinical Dietitian Pager # [phone] After hours/weekend pager # [phone]

## 2014-10-17 NOTE — Progress Notes (Signed)
1:1 Safety Note Pt is presently safe in room, with daughter-in-law, interpreter, and 1:1 safety sitter present. She has eaten some of her dinner and consumed more than one full cup of water. Multiple foods, snacks, and fluids have been offered by staff, but pt was reluctant to eat/drink until family member showed up. Family member was made aware earlier in the day by MD that pt had not been eating or drinking today. Will continue to monitor for needs/safety until care assumed by next shift.

## 2014-10-17 NOTE — BHH Counselor (Signed)
CSW attempted to meet with Pt along with interpreter and 1:1 mental health tech.  Despite multiple and extensive efforts to engage with Pt, Pt refused to open her eyes or respond with nonverbal gestures. Pt observed to be laying in bed with half of her face in the mattress. CSW will continue to attempt to assess Pt.  Chad Cordial, LCSWA Clinical Social Work [phone]

## 2014-10-17 NOTE — BHH Group Notes (Signed)
[facility] Group Therapy 10/17/2014 1:15pm  Type of Therapy: Mental Health Association Presentation  Pt did not attend, declined invitation.   Chad Cordial, LCSWA 10/17/2014 1:28 PM

## 2014-10-17 NOTE — Progress Notes (Signed)
Nursing 1:1 Note D: Patient resting in bed with eyes closed.  Respirations even and unlabored.  Patient appears to be in no apparent distress. A: Staff to monitor Q 15 mins for safety.   R:Patient remains safe on the unit.'   

## 2014-10-17 NOTE — Tx Team (Signed)
Interdisciplinary Treatment Plan Update (Adult) Date: 10/17/2014   Date: 10/17/2014 1:28 PM  Progress in Treatment:  Attending groups: No Participating in groups: No Taking medication as prescribed: At times Tolerating medication: Yes  Family/Significant othe contact made: No, CSW attempting to obtain consent Patient understands diagnosis: Continuing to assess Discussing patient identified problems/goals with staff: Yes  Medical problems stabilized or resolved: Yes  Denies suicidal/homicidal ideation: Unable to assess as Pt does not verbally respond Patient has not harmed self or Others: Yes   New problem(s) identified: None identified at this time.   Discharge Plan or Barriers:  CSW will assess for appropriate discharge plan and relevant barriers.   Additional comments: n/a   Reason for Continuation of Hospitalization:  Depression Medication stabilization Suicidal ideation  Estimated length of stay: 3-5 days  Review of initial/current patient goals per problem list:   1.  Goal(s): Patient will participate in aftercare plan  Met:  No  Target date: 3-5 days from date of admission   As evidenced by: Patient will participate within aftercare plan AEB aftercare provider and housing plan at discharge being identified.  10/17/14: CSW to work with Pt to assess for appropriate discharge plan and faciliate appointments and referrals as needed prior to d/c.  2.  Goal (s): Patient will exhibit decreased depressive symptoms and suicidal ideations.  Met:  No  Target date: 3-5 days from date of admission   As evidenced by: Patient will utilize self rating of depression at 3 or below and demonstrate decreased signs of depression or be deemed stable for discharge by MD. 10/17/14: Pt was admitted with symptoms of depression, rating 10/10. Pt continues to present with flat affect and depressive symptoms.  Pt will demonstrate decreased symptoms of depression and rate depression at 3/10 or  lower prior to discharge.  Attendees:  Patient:    Family:    Physician: Dr. Parke Poisson, MD  10/17/2014 1:28 PM  Nursing: Lars Pinks, RN Case manager  10/17/2014 1:28 PM  Clinical Social Worker Peri Maris, Latanya Presser, MSW 10/17/2014 1:28 PM  Other: Lucinda Dell, Beverly Sessions Liasion 10/17/2014 1:28 PM  Clinical: Kerby Nora, RN 10/17/2014 1:28 PM  Other: , RN Charge Nurse 10/17/2014 1:28 PM  Other:     Peri Maris, [place] MSW

## 2014-10-17 NOTE — Progress Notes (Signed)
D: Pt has been reluctant to speak for much of the day, even with interpreter present and 1:1 safety sitter. She has spoken at intervals, and perked up somewhat when Dr. Dub Mikes spoke with her, but she has mostly remained mute and in bed. She has declined to use her wheelchair but did walk with two-person assistance to bathroom, where she had a BM. She has refused fluids as well as breakfast and lunch.  A: Meds held after speaking with MD, who is aware that pt takes meds normally at 1800. Q15 safety checks maintained. 1:1 safety sitter present. Support/encouragement offered. R: Pt remains free from harm and continues with treatment. Will continue to monitor for needs/safety.

## 2014-10-17 NOTE — Progress Notes (Addendum)
Patient did not attend the evening karaoke group. Pt is currently on 1:1 observation and unable to go off unit.

## 2014-10-17 NOTE — Progress Notes (Signed)
1:1 Nursing Note Pt has been sleeping, with interpretor and 1:1 safety sitter present. Pt stirs when spoken to but appears focused on sleep. She appears in no acute distress. Morning meds held -- to be rescheduled for 1800, per MD. Will continue to monitor for needs/safety.

## 2014-10-18 ENCOUNTER — Inpatient Hospital Stay (HOSPITAL_COMMUNITY)
Admission: AD | Admit: 2014-10-18 | Discharge: 2014-10-21 | DRG: 885 | Disposition: A | Discharge status: Home / Self Care | Payer: Self-pay | Source: Other Acute Inpatient Hospital | Attending: Internal Medicine | Admitting: Internal Medicine

## 2014-10-18 ENCOUNTER — Encounter (HOSPITAL_COMMUNITY): Payer: Self-pay | Admitting: Internal Medicine

## 2014-10-18 DIAGNOSIS — F332 Major depressive disorder, recurrent severe without psychotic features: Secondary | ICD-10-CM | POA: Insufficient documentation

## 2014-10-18 DIAGNOSIS — R45851 Suicidal ideations: Secondary | ICD-10-CM | POA: Diagnosis present

## 2014-10-18 DIAGNOSIS — R410 Disorientation, unspecified: Secondary | ICD-10-CM

## 2014-10-18 DIAGNOSIS — E860 Dehydration: Secondary | ICD-10-CM | POA: Diagnosis present

## 2014-10-18 DIAGNOSIS — I517 Cardiomegaly: Secondary | ICD-10-CM | POA: Diagnosis present

## 2014-10-18 DIAGNOSIS — E162 Hypoglycemia, unspecified: Secondary | ICD-10-CM | POA: Diagnosis present

## 2014-10-18 DIAGNOSIS — E440 Moderate protein-calorie malnutrition: Secondary | ICD-10-CM | POA: Insufficient documentation

## 2014-10-18 DIAGNOSIS — F0390 Unspecified dementia without behavioral disturbance: Secondary | ICD-10-CM | POA: Diagnosis present

## 2014-10-18 DIAGNOSIS — F32A Depression, unspecified: Secondary | ICD-10-CM | POA: Diagnosis present

## 2014-10-18 DIAGNOSIS — G9341 Metabolic encephalopathy: Secondary | ICD-10-CM | POA: Diagnosis present

## 2014-10-18 DIAGNOSIS — Z6823 Body mass index (BMI) 23.0-23.9, adult: Secondary | ICD-10-CM

## 2014-10-18 DIAGNOSIS — J309 Allergic rhinitis, unspecified: Secondary | ICD-10-CM | POA: Diagnosis present

## 2014-10-18 DIAGNOSIS — Z79899 Other long term (current) drug therapy: Secondary | ICD-10-CM

## 2014-10-18 DIAGNOSIS — F329 Major depressive disorder, single episode, unspecified: Secondary | ICD-10-CM

## 2014-10-18 DIAGNOSIS — Z886 Allergy status to analgesic agent status: Secondary | ICD-10-CM

## 2014-10-18 DIAGNOSIS — I10 Essential (primary) hypertension: Secondary | ICD-10-CM | POA: Diagnosis present

## 2014-10-18 LAB — MAGNESIUM: Magnesium: 2.1 mg/dL (ref 1.7–2.4)

## 2014-10-18 LAB — CBC WITH DIFFERENTIAL/PLATELET
BASOS ABS: 0 10*3/uL (ref 0.0–0.1)
Basophils Relative: 0 %
EOS PCT: 1 %
Eosinophils Absolute: 0.1 10*3/uL (ref 0.0–0.7)
HEMATOCRIT: 41 % (ref 36.0–46.0)
Hemoglobin: 13.8 g/dL (ref 12.0–15.0)
LYMPHS ABS: 2.4 10*3/uL (ref 0.7–4.0)
LYMPHS PCT: 24 %
MCH: 29.8 pg (ref 26.0–34.0)
MCHC: 33.7 g/dL (ref 30.0–36.0)
MCV: 88.6 fL (ref 78.0–100.0)
MONO ABS: 0.6 10*3/uL (ref 0.1–1.0)
MONOS PCT: 6 %
NEUTROS ABS: 6.9 10*3/uL (ref 1.7–7.7)
Neutrophils Relative %: 69 %
Platelets: 301 10*3/uL (ref 150–400)
RBC: 4.63 MIL/uL (ref 3.87–5.11)
RDW: 13.3 % (ref 11.5–15.5)
WBC: 10 10*3/uL (ref 4.0–10.5)

## 2014-10-18 LAB — PHOSPHORUS: Phosphorus: 4.4 mg/dL (ref 2.5–4.6)

## 2014-10-18 LAB — TROPONIN I

## 2014-10-18 LAB — GLUCOSE, CAPILLARY: GLUCOSE-CAPILLARY: 82 mg/dL (ref 65–99)

## 2014-10-18 LAB — LACTIC ACID, PLASMA: Lactic Acid, Venous: 0.8 mmol/L (ref 0.5–2.0)

## 2014-10-18 LAB — CK: Total CK: 50 U/L (ref 38–234)

## 2014-10-18 MED ORDER — ONDANSETRON HCL 4 MG PO TABS: 4.0000 mg | ORAL_TABLET | Freq: Four times a day (QID) | ORAL | Status: DC | PRN

## 2014-10-18 MED ORDER — ONDANSETRON HCL 4 MG/2ML IJ SOLN: 4.0000 mg | Freq: Four times a day (QID) | INTRAMUSCULAR | Status: DC | PRN

## 2014-10-18 MED ORDER — HYDROCODONE-ACETAMINOPHEN 5-325 MG PO TABS: 1.0000 | ORAL_TABLET | ORAL | Status: DC | PRN

## 2014-10-18 MED ORDER — FOLIC ACID 5 MG/ML IJ SOLN
1.0000 mg | Freq: Every day | INTRAMUSCULAR | Status: DC
  Administered 2014-10-19 – 2014-10-20 (×2): 1 mg via INTRAVENOUS
  Filled 2014-10-18 (×5): qty 0.2

## 2014-10-18 MED ORDER — SERTRALINE HCL 25 MG PO TABS
25.0000 mg | ORAL_TABLET | Freq: Every day | ORAL | Status: DC
  Administered 2014-10-19 – 2014-10-21 (×3): 25 mg via ORAL
  Filled 2014-10-18 (×3): qty 1

## 2014-10-18 MED ORDER — ACETAMINOPHEN 325 MG PO TABS: 650.0000 mg | ORAL_TABLET | Freq: Four times a day (QID) | ORAL | Status: DC | PRN

## 2014-10-18 MED ORDER — DEXTROSE-NACL 5-0.45 % IV SOLN
INTRAVENOUS | Status: DC
  Administered 2014-10-18: 23:00:00 via INTRAVENOUS

## 2014-10-18 MED ORDER — THIAMINE HCL 100 MG/ML IJ SOLN
100.0000 mg | Freq: Every day | INTRAMUSCULAR | Status: DC
  Administered 2014-10-19 – 2014-10-20 (×2): 100 mg via INTRAVENOUS
  Filled 2014-10-18 (×3): qty 1

## 2014-10-18 MED ORDER — NYSTATIN 100000 UNIT/ML MT SUSP
5.0000 mL | Freq: Four times a day (QID) | OROMUCOSAL | Status: DC
  Administered 2014-10-19 – 2014-10-21 (×5): 500000 [IU] via ORAL
  Filled 2014-10-18 (×14): qty 5

## 2014-10-18 MED ORDER — FAMOTIDINE 20 MG PO TABS
20.0000 mg | ORAL_TABLET | Freq: Every day | ORAL | Status: DC
  Administered 2014-10-20 – 2014-10-21 (×2): 20 mg via ORAL
  Filled 2014-10-18 (×3): qty 1

## 2014-10-18 MED ORDER — CALCIUM CARBONATE-VITAMIN D 500-200 MG-UNIT PO TABS: 1.0000 | ORAL_TABLET | Freq: Every day | ORAL | Status: DC

## 2014-10-18 MED ORDER — SERTRALINE HCL 25 MG PO TABS: 25.0000 mg | ORAL_TABLET | Freq: Every day | ORAL | Status: DC

## 2014-10-18 MED ORDER — ENSURE ENLIVE PO LIQD
237.0000 mL | Freq: Three times a day (TID) | ORAL | Status: DC
Start: 2014-10-19 — End: 2014-10-21
  Administered 2014-10-19 – 2014-10-20 (×4): 237 mL via ORAL

## 2014-10-18 MED ORDER — AMLODIPINE BESYLATE 10 MG PO TABS
10.0000 mg | ORAL_TABLET | Freq: Every day | ORAL | Status: DC
  Administered 2014-10-19 – 2014-10-21 (×3): 10 mg via ORAL
  Filled 2014-10-18 (×3): qty 1

## 2014-10-18 MED ORDER — AMLODIPINE BESYLATE 10 MG PO TABS: 10.0000 mg | ORAL_TABLET | Freq: Every day | ORAL | Status: DC

## 2014-10-18 MED ORDER — ACETAMINOPHEN 650 MG RE SUPP: 650.0000 mg | Freq: Four times a day (QID) | RECTAL | Status: DC | PRN

## 2014-10-18 NOTE — Progress Notes (Addendum)
1:1 entered 1402 for1000   D Pt observed sleeping in her bed. Her eyesa re closed. She is layig supine on her back forehead is relaxed and her expression is one of relaxation , calm and pease. Her breathing is mod slow, deep and regular.    A She does not open her eyes when her name is called. There are no vitals available from this morning ( pt refused).    R Safety in place. Pt asleep and calm.

## 2014-10-18 NOTE — H&P (Signed)
PCP:  Lora Paula, MD    Referring provider transfer from behavioral health   Chief Complaint:  transfer from behavioral health due to confusion  HPI: Barbara Beck is a 81 y.o. female   has a past medical history of Hypertension (? ); GERD (gastroesophageal reflux disease) (? ); Suicidal ideation; Anxiety; and Depression.   Presented with  Patient has been suffering from major depressive disorder with decreased by mouth intake. Patient have frequent admission to medical service as well as behavioral health. Most recent on 28th of September she was transferred from behavioral health to [facility] because patient almost syncopized secondary to poor by mouth intake. She had positive orthostatics and admissions. At that time she was found to have a urinary tract infection urine culture grew multiple organisms but was nonspecific and completed antibiotics therapy. Patient was admitted for IV rehydration and at first did well. The plan is patient to be discharged to psychiatric geriatric facility but patient had no insurance and the family did not want to be placed far away. She was transferred back to behavioral health on 28th. While there she continued to take poor by mouth intake. Patient has been noncommunicative for staph. She has been refusing her favorite foods. Nutritional consult have seen patient recommended ensure by mouth twice a day. Patient is originally from [place] but has been living in [place] for the past 2 years she is Spanish-speaking only. Family endorses a patient have had questionable dementia. In the past when she was able to ambulate she would go to hair Salons and get treatment and then promise that the family would be paying for it. Patient on numerous occasions have been telling the family that she wants to go home and they will change her mind and states she would like to go to behavioral health.  Apparently today while at [facility]  patient completely refused to take her medications or to take anything by mouth. She only eats when particular family members are present.  She has not had much of any PO intake over past 24 hours. Psychiatry felt the patient was not responding as her usual self and requested medical consult. Given patient's potential for dehydration she was transferred to [facility] long for rehydration. On arrival to floor patient blood sugar was 75. After lengthy discussion with family at this point they state that they would like for patient to remain full code. Although family members would be interested in artificial feeding the main power of attorney  family member is unsure.  Hospitalist was called for admission for dehydration and malnutrition  Review of Systems:    Pertinent positives include:  fatigue, weight loss decreased mood decreased interest  Constitutional:  No weight loss, night sweats, Fevers, chills,  HEENT:  No headaches, Difficulty swallowing,Tooth/dental problems,Sore throat,  No sneezing, itching, ear ache, nasal congestion, post nasal drip,  Cardio-vascular:  No chest pain, Orthopnea, PND, anasarca, dizziness, palpitations.no Bilateral lower extremity swelling  GI:  No heartburn, indigestion, abdominal pain, nausea, vomiting, diarrhea, change in bowel habits, loss of appetite, melena, blood in stool, hematemesis Resp:  no shortness of breath at rest. No dyspnea on exertion, No excess mucus, no productive cough, No non-productive cough, No coughing up of blood.No change in color of mucus.No wheezing. Skin:  no rash or lesions. No jaundice GU:  no dysuria, change in color of urine, no urgency or frequency. No straining to urinate.  No flank pain.  Musculoskeletal:  No joint pain or  no joint swelling. No decreased range of motion. No back pain.  Psych:  No change in mood or affect. No depression or anxiety. No memory loss.  Neuro: no localizing neurological complaints, no tingling, no  weakness, no double vision, no gait abnormality, no slurred speech, no confusion  Otherwise ROS are negative except for above, 10 systems were reviewed  Past Medical History: Past Medical History  Diagnosis Date  . Hypertension ?   Marland Kitchen GERD (gastroesophageal reflux disease) ?   Marland Kitchen Suicidal ideation   . Anxiety   . Depression    Past Surgical History  Procedure Laterality Date  . Eye surgery Left     Cataract Surgery      Medications: Prior to Admission medications   Medication Sig Start Date End Date Taking? Authorizing Provider  amLODipine (NORVASC) 10 MG tablet Take 1 tablet (10 mg total) by mouth daily. 08/08/14  Yes Josalyn Funches, MD  Calcium Carb-Cholecalciferol (CALCIUM-VITAMIN D) 600-400 MG-UNIT TABS Take 1 tablet by mouth daily.    Yes Historical Provider, MD  feeding supplement, ENSURE ENLIVE, (ENSURE ENLIVE) LIQD Take 237 mLs by mouth 3 (three) times daily between meals. 10/16/14  Yes Belkys A Regalado, MD  fluticasone (FLONASE) 50 MCG/ACT nasal spray Place 2 sprays into both nostrils daily. 01/29/14  Yes Josalyn Funches, MD  sertraline (ZOLOFT) 25 MG tablet Take 1 tablet (25 mg total) by mouth daily. 10/16/14  Yes Belkys A Regalado, MD  acetaminophen (TYLENOL) 325 MG tablet Take 2 tablets (650 mg total) by mouth 3 (three) times daily as needed for moderate pain. 03/11/14   Dessa Phi, MD  cyanocobalamin 500 MCG tablet Take 1 tablet (500 mcg total) by mouth daily. 02/28/14   Josalyn Funches, MD  nystatin (MYCOSTATIN) 100000 UNIT/ML suspension Take 5 mLs (500,000 Units total) by mouth 4 (four) times daily. 10/16/14   Belkys A Regalado, MD  pyridOXINE (B-6) 50 MG tablet Take 1 tablet (50 mg total) by mouth daily. 02/28/14   Josalyn Funches, MD  ranitidine (ZANTAC) 150 MG tablet TAKE 1 TABLET BY MOUTH 2 TIMES DAILY Patient taking differently: TAKE 1 TABLET BY MOUTH AT BEDTIME 08/02/14   Josalyn Funches, MD  Thiamine 50 MG CAPS Take 1 capsule (50 mg total) by mouth daily. 02/28/14    Dessa Phi, MD    Allergies:   Allergies  Allergen Reactions  . Aspirin Hives and Nausea Only    NOTHING THAT CONTAINS ASPIRIN    Social History:  Ambulatory  independently   Lives at home  With family From facility Behavioral health   reports that she quit smoking about 30 years ago. Her smoking use included Cigarettes. She smoked 0.00 packs per day for 13 years. She has never used smokeless tobacco. She reports that she does not drink alcohol or use illicit drugs.    Family History: family history includes Cancer in her daughter; Hypertension in her father.    Physical Exam: Patient Vitals for the past 24 hrs:  BP Temp Temp src Pulse Resp SpO2 Height Weight  10/18/14 2042 (!) 169/61 mmHg 97.7 F (36.5 C) Oral 73 18 98 % 5' (1.524 m) 53.978 kg (119 lb)    1. General:  in No Acute distress 2. Psychological: Alert and   Oriented flat affect 3. Head/ENT:     Dry Mucous Membranes                          Head Non traumatic, neck supple  Poor Dentition 4. SKIN:  decreased Skin turgor,  Skin clean Dry and intact no rash 5. Heart: Regular rate and rhythm no Murmur, Rub or gallop 6. Lungs: Clear to auscultation bilaterally, no wheezes or crackles   7. Abdomen: Soft, non-tender, Non distended 8. Lower extremities: no clubbing, cyanosis, or edema 9. Neurologically strength 5 out of 5 in all 4 extremities patient appears to be following commands 10. MSK: Normal range of motion  body mass index is 23.24 kg/(m^2).   Labs on Admission:   Results for orders placed or performed during the hospital encounter of 10/18/14 (from the past 24 hour(s))  CBC with Differential/Platelet     Status: None   Collection Time: 10/18/14  9:20 PM  Result Value Ref Range   WBC 10.0 4.0 - 10.5 K/uL   RBC 4.63 3.87 - 5.11 MIL/uL   Hemoglobin 13.8 12.0 - 15.0 g/dL   HCT 16.1 09.6 - 04.5 %   MCV 88.6 78.0 - 100.0 fL   MCH 29.8 26.0 - 34.0 pg   MCHC 33.7 30.0 - 36.0  g/dL   RDW 40.9 81.1 - 91.4 %   Platelets 301 150 - 400 K/uL   Neutrophils Relative % 69 %   Neutro Abs 6.9 1.7 - 7.7 K/uL   Lymphocytes Relative 24 %   Lymphs Abs 2.4 0.7 - 4.0 K/uL   Monocytes Relative 6 %   Monocytes Absolute 0.6 0.1 - 1.0 K/uL   Eosinophils Relative 1 %   Eosinophils Absolute 0.1 0.0 - 0.7 K/uL   Basophils Relative 0 %   Basophils Absolute 0.0 0.0 - 0.1 K/uL    UA ordered pending  Lab Results  Component Value Date   HGBA1C 6.1 01/29/2014    Estimated Creatinine Clearance: 36.9 mL/min (by C-G formula based on Cr of 0.69).  BNP (last 3 results) No results for input(s): PROBNP in the last 8760 hours.  Other results:  I have pearsonaly reviewed this: ECG odered   [facility] Weights   10/18/14 2042  Weight: 53.978 kg (119 lb)     Cultures:    Component Value Date/Time   SDES URINE, RANDOM 10/09/2014 1946   SPECREQUEST NONE 10/09/2014 1946   CULT  10/09/2014 1946    MULTIPLE SPECIES PRESENT, SUGGEST RECOLLECTION Performed at [facility]    REPTSTATUS 10/11/2014 FINAL 10/09/2014 1946     Radiological Exams on Admission: No results found.  Chart has been reviewed  Family  at  Bedside  plan of care was discussed with   Son and Niece  Assessment/Plan 81 year old female with history of depression and possibly dementia. Presents with somewhat worsening confusion in the setting of poor by mouth intake and dehydration hypoglycemia Being admitted for fluid rehydration Present on Admission:  . Dehydration - will administer IV fluids . Major depressive disorder, recurrent, severe without psychotic features  will need psychiatry to continue follow along  . Hypertension- continue home medications of tolerate  . Metabolic encephalopathy -in the setting of dehydration. Patient's mental status is difficult to assess given memory which vary as well as for operation on the part of the patient, consider underlying dementia divorcing confusion in the  setting of unfamiliar environment. Discussed this patient's family will attempt to check for any medical reasons. Will check B-12 level TSH has been test checked 2 weeks ago and was within normal limits. Patient had had CT scans and MRI as recently as 2 weeks ago which was also unremarkable  . Hypoglycemia - will  monitor hypoglycemia protocol and administer D5 half normal fluids   Prophylaxis: SCD    CODE STATUS:  FULL CODE as per family  Disposition:  likely will need placement for rehabilitation     Other plan as per orders.  I have spent a total of 55 min on this admission  DOUTOVA,ANASTASSIA 10/18/2014, 9:56 PM  [facility]  Pager [phone]   after 2 AM please page floor coverage PA If 7AM-7PM, please contact the day team taking care of the patient  [email or web address]  Password [email or web address]

## 2014-10-18 NOTE — BHH Group Notes (Signed)
[facility] LCSW Aftercare Discharge Planning Group Note  10/18/2014 8:45 AM  Pt did not attend, declined invitation.   Chad Cordial, LCSWA 10/18/2014 9:20 AM

## 2014-10-18 NOTE — Progress Notes (Signed)
[facility] MD Progress Note  10/18/2014 6:48 PM Barbara Beck  MRN:  [id number] Subjective:  Last evening family came and Barbara Beck was very interactive. The plan was to D/C her tomorrow Saturday as her family needed to make arrangements. Today she is uncooperative even with her daugther in law. She did tell her daughter in law " you can't take care of me in my condition" sees herself as hopeless helpless Principal Problem: Severe recurrent major depression without psychotic features Diagnosis:   Patient Active Problem List   Diagnosis Date Noted  . MDD (major depressive disorder), recurrent severe, without psychosis [F33.2] 10/11/2014  . Dehydration [E86.0]   . Thrush [B37.0]   . Syncope and collapse [R55] 10/09/2014  . Syncope [R55] 10/09/2014  . Metabolic encephalopathy [G93.41] 10/09/2014  . UTI (urinary tract infection) [N39.0] 10/09/2014  . 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 Depression [F32.9] 10/07/2014  . Poor dentition [K08.8] 08/01/2014  . Decreased visual acuity [H54.7] 08/01/2014  . Sinus bradycardia [R00.1] 04/12/2014  . Chronic diarrhea [K52.9] 03/19/2014  . Bradycardia [R00.1] 02/19/2014  . Cataract [H26.9] 02/19/2014  . Bartholin gland cyst [N75.0] 02/19/2014  . Right sided abdominal pain [R10.9] 01/29/2014  . Allergic rhinoconjunctivitis of both eyes [J30.9, H10.13] 01/29/2014  . Vitamin D deficiency [E55.9] 01/29/2014  . SOB (shortness of breath) [R06.02] 01/29/2014  . Tingling [R20.2] 01/29/2014  . Spanish speaking patient [R47.89] 01/29/2014  . Osteoarthritis of left hip [M16.12] 01/29/2014  . DJD (degenerative joint disease), lumbar [M47.816] 01/29/2014  . Umbilical hernia [K42.9] 01/29/2014  . Hypertension [I10]   . GERD (gastroesophageal reflux disease) [K21.9]    Total Time spent with patient: 30 minutes  Past Psychiatric History: had an episode of depression for what she dit not receive any treatement  Past Medical  History:  Past Medical History  Diagnosis Date  . Hypertension ?   Marland Kitchen GERD (gastroesophageal reflux disease) ?   Marland Kitchen Suicidal ideation   . Anxiety   . Depression     Past Surgical History  Procedure Laterality Date  . Eye surgery Left     Cataract Surgery    Family History:  Family History  Problem Relation Age of Onset  . Cancer Daughter     breast with bone mets   . Hypertension Father    Family Psychiatric  History: see admission H and P Social History:  History  Alcohol Use No    Comment: denied     History  Drug Use No    Comment: denied all drugs, alcohol, and tobacco    Social History   Social History  . Marital Status: Widowed    Spouse Name: N/A  . Number of Children: 5  . Years of Education: 0   Occupational History  . Retired     Social History Main Topics  . Smoking status: Former Smoker -- 0.00 packs/day for 13 years    Types: Cigarettes    Quit date: 01/19/1984  . Smokeless tobacco: Never Used  . Alcohol Use: No     Comment: denied  . Drug Use: No     Comment: denied all drugs, alcohol, and tobacco  . Sexual Activity: No   Other Topics Concern  . None   Social History Narrative   From [place].   Moved to [place] in 2014, back to [place], back to [place] 01/18/2014.    Did not attend school.    Has 4 living children. Had 5 total, one died at  6 months old.    Lives with son and daughter-in-law in [place].   Has a daughter, Margaretmary Lombard, who is in the [place].    Additional Social History:    Pain Medications: none Prescriptions: norvasc   clcum   cyanocobabmin   flonase   pyridoxine  zantac   zoloft   thiamine Over the Counter: none History of alcohol / drug use?: No history of alcohol / drug abuse Longest period of sobriety (when/how long): denied all drugs, alcohol, tobacco use, never used Negative Consequences of Use: Personal relationships (declining health) Withdrawal Symptoms: Other (Comment) (denied all substance abuse)                     Sleep: Fair  Appetite:  Poor  Current Medications: Current Facility-Administered Medications  Medication Dose Route Frequency Provider Last Rate Last Dose  . acetaminophen (TYLENOL) tablet 650 mg  650 mg Oral Q6H PRN Charm Rings, NP      . alum & mag hydroxide-simeth (MAALOX/MYLANTA) 200-200-20 MG/5ML suspension 30 mL  30 mL Oral Q4H PRN Charm Rings, NP      . amLODipine (NORVASC) tablet 10 mg  10 mg Oral Daily Charm Rings, NP   10 mg at 10/17/14 1804  . calcium-vitamin D (OSCAL WITH D) 500-200 MG-UNIT per tablet 1 tablet  1 tablet Oral Daily Charm Rings, NP   1 tablet at 10/17/14 1804  . feeding supplement (ENSURE ENLIVE) (ENSURE ENLIVE) liquid 237 mL  237 mL Oral BID BM Rockey Situ Cobos, MD   237 mL at 10/17/14 1400  . fluticasone (FLONASE) 50 MCG/ACT nasal spray 2 spray  2 spray Each Nare Daily Charm Rings, NP   2 spray at 10/17/14 1805  . magnesium hydroxide (MILK OF MAGNESIA) suspension 30 mL  30 mL Oral Daily PRN Charm Rings, NP      . sertraline (ZOLOFT) tablet 25 mg  25 mg Oral Daily Charm Rings, NP   25 mg at 10/17/14 1805    Lab Results: No results found for this or any previous visit (from the past 48 hour(s)).  Physical Findings: AIMS: Facial and Oral Movements Muscles of Facial Expression: None, normal Lips and Perioral Area: None, normal Jaw: None, normal Tongue: None, normal,Extremity Movements Upper (arms, wrists, hands, fingers): None, normal Lower (legs, knees, ankles, toes): None, normal, Trunk Movements Neck, shoulders, hips: None, normal, Overall Severity Severity of abnormal movements (highest score from questions above): None, normal Incapacitation due to abnormal movements: None, normal Patient's awareness of abnormal movements (rate only patient's report): No Awareness, Dental Status Current problems with teeth and/or dentures?: No Does patient usually wear dentures?: No  CIWA:  CIWA-Ar Total: 0 COWS:  COWS Total Score:  1  Musculoskeletal: Strength & Muscle Tone: within normal limits Gait & Station: will not ambulate Patient leans: has to be hold for her not to fall back  Psychiatric Specialty Exam: Review of Systems  Constitutional: Positive for malaise/fatigue.  HENT: Negative.   Eyes: Negative.   Respiratory: Negative.   Cardiovascular: Negative.   Gastrointestinal: Negative.   Genitourinary: Negative.   Musculoskeletal:       "legs feel heavy"  Skin: Negative.   Neurological: Positive for weakness.  Endo/Heme/Allergies: Negative.   Psychiatric/Behavioral: Positive for depression.    Blood pressure 167/75, pulse 97, temperature 98.6 F (37 C), temperature source Oral, resp. rate 16, height  (1.448 m), weight 54.885 kg (121 lb), SpO2 99 %.Body mass index  is 26.18 kg/(m^2).  General Appearance: Disheveled  Eye Contact::  Minimal  Speech:  hardly  communicates  Volume:  Decreased  Mood:  Depressed  Affect:  Depressed  Thought Process:  no spontaneous content  Orientation:  Other:  unable to evaluate  Thought Content:  no spontaneous content  Suicidal Thoughts:  none elicited today  Homicidal Thoughts:  No  Memory:  Could not evaluate  Judgement:  Impaired  Insight:  Lacking  Psychomotor Activity:  Decreased  Concentration:  Poor  Recall:  Poor  Fund of Knowledge:Poor  Language: Poor  Akathisia:  No  Handed:  Right  AIMS (if indicated):     Assets:  Social Support  ADL's:  Impaired  Cognition: Impaired,  Moderate  Sleep:  Number of Hours: 6.75   Treatment Plan Summary: Daily contact with patient to assess and evaluate symptoms and progress in treatment and Medication management Supportive approach/coping skills Depression; will continue to encourage to take her medications be more active interact Will assess her hydration status  Will reassess her mental status for further deterioration Consider IV hydration  LUGO,IRVING A 10/18/2014, 6:48 PM

## 2014-10-18 NOTE — Progress Notes (Signed)
Attempted to call report approximately 2015 to [address]. I was told RN who will be assigned to patient Chief Technology Officer) was unavailable and in a patient's room. I instructed them that EMS was here at [facility] and patient was in route. Currently awaiting [facility] to call back for report.

## 2014-10-18 NOTE — Progress Notes (Signed)
1:1 note for [name] is tJessaca same. Staying in bed, she still has her eyes clinched shut. She will not speak. She will not respnd verbally and/ or visually. She is resistant with her arms when this nurse tried to assisst her sister in law, in repositioning her . As wellas assissting Lakisa to take her 1800 meds scheduled. Pt was observed swalowing calcium carbontae. Remainder of 1800 meds given to oncoming RN to cont  administering . R Dr. Dub Mikes present and in the process of  direct admitting her to [facility].

## 2014-10-18 NOTE — Progress Notes (Signed)
CSW attempted to meet with Pt again to have consents signed for releasing information to [facility] [place]. Pt would not respond verbally or nonverbally. CSW left consent with 1:1 staff in order to have Pt's daughter help encourage her to sign release.   Chad Cordial, LCSWA Clinical Social Work [phone]

## 2014-10-18 NOTE — Progress Notes (Signed)
D: Patient resting in bed with eyes closed.  Respirations even and unlabored.  Patient appears to be in no apparent distress. A: Staff to monitor Q 15 mins for safety.  Patient remains on 1:1 for safety. R:  Patient remains safe on the unit.  

## 2014-10-18 NOTE — B[facility] Suicide Risk Assessment (Signed)
[facility] INPATIENT:  Family/Significant Other Suicide Prevention Education  Suicide Prevention Education:  Education Completed; Maple Hudson, Pt's daughter [phone]),  shas been identified by the patient as the family member/significant other with whom the patient will be residing, and identified as the person(s) who will aid the patient in the event of a mental health crisis (suicidal ideations/suicide attempt).  With written consent from the patient, the family member/significant other has been provided the following suicide prevention education, prior to the and/or following the discharge of the patient.  The suicide prevention education provided includes the following:  Suicide risk factors  Suicide prevention and interventions  National Suicide Hotline telephone number  [facility] assessment telephone number  [facility] Emergency Assistance 911  [facility] and/or Residential Mobile Crisis Unit telephone number  Request made of family/significant other to:  Remove weapons (e.g., guns, rifles, knives), all items previously/currently identified as safety concern.    Remove drugs/medications (over-the-counter, prescriptions, illicit drugs), all items previously/currently identified as a safety concern.  The family member/significant other verbalizes understanding of the suicide prevention education information provided.  The family member/significant other agrees to remove the items of safety concern listed above.  Elaina Hoops 10/18/2014, 3:57 PM

## 2014-10-18 NOTE — Progress Notes (Addendum)
1:1 entered late for 1400  D Pt remains. Flat on her back. MHT confirms that pt has not taken any liquid in and / or voided anything out today...since 0700. Pt adamantly  Refuses to allow  staff to check her BP and becomes combative with this nurse  When this nurse repositions her to the top of the bed. The interpreter is present, communicates to the patient, who refuses to answer nurses' assessment questions Dr Dub Mikes is made aware of pt's status, refusal to get OOB, refusal to eat and / or drink and called pt's sister in law, who is expected to come to hospital to ( hopefully  encourage ) pt to comply. A Pt resting in bed quietly. R 1:1 cont .

## 2014-10-18 NOTE — Progress Notes (Signed)
Report called to [facility]. Patient was transported by EMS off floor approximately 2020.

## 2014-10-18 NOTE — Progress Notes (Signed)
Nursing 1:1 Note D: Patient resting in bed with eyes closed.  Respirations even and unlabored.  Patient appears to be in no apparent distress. A: Staff to monitor Q 15 mins for safety.  Patient remains on 1:1 for safety. R:Patient remains safe on the unit.  

## 2014-10-18 NOTE — [facility] Group Notes (Signed)
[facility] LCSW Group Therapy 10/18/2014 1:15pm  Type of Therapy: Group Therapy- Feelings Around Relapse and Recovery  Pt did not attend, declined invitation.   Chad Cordial, Theresia Majors [phone] 10/18/2014 4:40 PM

## 2014-10-18 NOTE — BHH Counselor (Signed)
Pt continues to be unresponsive to CSW or interpreters prompts. PSA unable to be completed.  Chad Cordial, LCSWA Clinical Social Work [phone]

## 2014-10-19 ENCOUNTER — Observation Stay (HOSPITAL_COMMUNITY): Payer: Federal, State, Local not specified - Other

## 2014-10-19 DIAGNOSIS — F332 Major depressive disorder, recurrent severe without psychotic features: Principal | ICD-10-CM

## 2014-10-19 DIAGNOSIS — G9341 Metabolic encephalopathy: Secondary | ICD-10-CM

## 2014-10-19 DIAGNOSIS — I10 Essential (primary) hypertension: Secondary | ICD-10-CM

## 2014-10-19 DIAGNOSIS — F05 Delirium due to known physiological condition: Secondary | ICD-10-CM

## 2014-10-19 DIAGNOSIS — E860 Dehydration: Secondary | ICD-10-CM

## 2014-10-19 DIAGNOSIS — E162 Hypoglycemia, unspecified: Secondary | ICD-10-CM | POA: Diagnosis present

## 2014-10-19 LAB — GLUCOSE, CAPILLARY
Glucose-Capillary: 234 mg/dL — ABNORMAL HIGH (ref 65–99)
Glucose-Capillary: 95 mg/dL (ref 65–99)

## 2014-10-19 LAB — COMPREHENSIVE METABOLIC PANEL
ALBUMIN: 3.4 g/dL — AB (ref 3.5–5.0)
ALT: 43 U/L (ref 14–54)
ANION GAP: 8 (ref 5–15)
AST: 26 U/L (ref 15–41)
Alkaline Phosphatase: 82 U/L (ref 38–126)
BUN: 12 mg/dL (ref 6–20)
CHLORIDE: 101 mmol/L (ref 101–111)
CO2: 26 mmol/L (ref 22–32)
Calcium: 8.7 mg/dL — ABNORMAL LOW (ref 8.9–10.3)
Creatinine, Ser: 0.73 mg/dL (ref 0.44–1.00)
GFR calc Af Amer: >60 mL/min (ref >60)
GLUCOSE: 130 mg/dL — AB (ref 65–99)
POTASSIUM: 3.4 mmol/L — AB (ref 3.5–5.1)
Sodium: 135 mmol/L (ref 135–145)
Total Bilirubin: 0.8 mg/dL (ref 0.3–1.2)
Total Protein: 7 g/dL (ref 6.5–8.1)

## 2014-10-19 LAB — CBC
HEMATOCRIT: 43.6 % (ref 36.0–46.0)
HEMOGLOBIN: 14.9 g/dL (ref 12.0–15.0)
MCH: 30 pg (ref 26.0–34.0)
MCHC: 34.2 g/dL (ref 30.0–36.0)
MCV: 87.9 fL (ref 78.0–100.0)
Platelets: 251 10*3/uL (ref 150–400)
RBC: 4.96 MIL/uL (ref 3.87–5.11)
RDW: 13.3 % (ref 11.5–15.5)
WBC: 10.1 10*3/uL (ref 4.0–10.5)

## 2014-10-19 LAB — FOLATE: Folate: 14.4 ng/mL (ref >5.9)

## 2014-10-19 LAB — MAGNESIUM: Magnesium: 2 mg/dL (ref 1.7–2.4)

## 2014-10-19 LAB — PREALBUMIN: PREALBUMIN: 24.2 mg/dL (ref 18–38)

## 2014-10-19 LAB — PHOSPHORUS: Phosphorus: 3.7 mg/dL (ref 2.5–4.6)

## 2014-10-19 LAB — VITAMIN B12: VITAMIN B 12: 705 pg/mL (ref 180–914)

## 2014-10-19 MED ORDER — DONEPEZIL HCL 5 MG PO TABS
5.0000 mg | ORAL_TABLET | Freq: Every day | ORAL | Status: DC
  Administered 2014-10-19 – 2014-10-21 (×3): 5 mg via ORAL
  Filled 2014-10-19 (×3): qty 1

## 2014-10-19 MED ORDER — POTASSIUM CHLORIDE 2 MEQ/ML IV SOLN
INTRAVENOUS | Status: DC
  Administered 2014-10-19 – 2014-10-20 (×2): via INTRAVENOUS
  Filled 2014-10-19 (×6): qty 1000

## 2014-10-19 NOTE — Progress Notes (Signed)
[facility] PROGRESS NOTE    Progress NBernise Sylvain Rexrode ZOX:[id number] DOB: 1927/08/16 DOA: 10/18/2014 PCP: Lora Paula, MD   Brief Narrative:   Barbara Beck is an 81 y.o. female   Assessment/Plan:  Metabolic encephalopathy/acute confusional state versus underlying dementia: - She was started on IV fluid hydration she is hard to arouse today. - We'll have to narcotics, will continue Zoloft. - She might be developing some kind of cognitive impairment due to the change of environment at her age the fact that she's cannot speak the language makes an anonymous person this age. - Haldol when necessary for agitation. Basic metabolic panel is pending.   Essential Hypertension: - Continue Norvasc blood pressure seems to be stable.    Major depressive disorder, recurrent, severe without psychotic features: Continue Zoloft.  Dehydration: - Continue IV fluids.  Moderate malnutrition: - Continue ensure.  Hypoglycemia: None while in the hospital continue to monitor CBGs.    DVT Prophylaxis - Lovenox ordered.  Family Communication: none Disposition Plan: Home when stable. Code Status:     Code Status Orders        Start     Ordered   10/18/14 2307  Full code   Continuous     10/18/14 2308        IV Access:    Peripheral IV   Procedures and diagnostic studies:   No results found.   Medical Consultants:    None.  Anti-Infectives:   Anti-infectives    None      Subjective:    Lavonne Beck patient is hard to arouse does not want to talk.  Objective:    Filed Vitals:   10/18/14 2042  BP: 169/61  Pulse: 73  Temp: 97.7 F (36.5 C)  TempSrc: Oral  Resp: 18  Height: 5' (1.524 m)  Weight: 53.978 kg (119 lb)  SpO2: 98%   No intake or output data in the 24 hours ending 10/19/14 0930 Filed Weights   10/18/14 2042  Weight: 53.978 kg (119 lb)    Exam: Gen:  NAD Cardiovascular:  RRR, No M/R/G Respiratory:  Lungs  CTAB Gastrointestinal:  Abdomen soft, NT/ND, + BS Extremities:  No C/E/C   Data Reviewed:    Labs: Basic Metabolic Panel:  Recent Labs Lab 10/13/14 1617 10/18/14 2120  NA 135  --   K 3.4*  --   CL 101  --   CO2 27  --   GLUCOSE 171*  --   BUN 10  --   CREATININE 0.69  --   CALCIUM 8.3*  --   MG  --  2.1  PHOS  --  4.4   GFR Estimated Creatinine Clearance: 36.9 mL/min (by C-G formula based on Cr of 0.69). Liver Function Tests: No results for input(s): AST, ALT, ALKPHOS, BILITOT, PROT, ALBUMIN in the last 168 hours. No results for input(s): LIPASE, AMYLASE in the last 168 hours. No results for input(s): AMMONIA in the last 168 hours. Coagulation profile No results for input(s): INR, PROTIME in the last 168 hours.  CBC:  Recent Labs Lab 10/18/14 2120  WBC 10.0  NEUTROABS 6.9  HGB 13.8  HCT 41.0  MCV 88.6  PLT 301   Cardiac Enzymes:  Recent Labs Lab 10/18/14 2120  CKTOTAL 50  TROPONINI <0.03   BNP (last 3 results) No results for input(s): PROBNP in the last 8760 hours. CBG:  Recent Labs Lab 10/18/14 2201 10/19/14 0006  GLUCAP 82 95   D-Dimer: No results  for input(s): DDIMER in the last 72 hours. Hgb A1c: No results for input(s): HGBA1C in the last 72 hours. Lipid Profile: No results for input(s): CHOL, HDL, LDLCALC, TRIG, CHOLHDL, LDLDIRECT in the last 72 hours. Thyroid function studies: No results for input(s): TSH, T4TOTAL, T3FREE, THYROIDAB in the last 72 hours.  Invalid input(s): FREET3 Anemia work up: No results for input(s): VITAMINB12, FOLATE, FERRITIN, TIBC, IRON, RETICCTPCT in the last 72 hours. Sepsis Labs:  Recent Labs Lab 10/18/14 2120  WBC 10.0  LATICACIDVEN 0.8   Microbiology Recent Results (from the past 240 hour(s))  Urine culture     Status: None   Collection Time: 10/09/14  7:46 PM  Result Value Ref Range Status   Specimen Description URINE, RANDOM  Final   Special Requests NONE  Final   Culture   Final     MULTIPLE SPECIES PRESENT, SUGGEST RECOLLECTION Performed at [facility]    Report Status 10/11/2014 FINAL  Final     Medications:   . amLODipine  10 mg Oral Daily  . famotidine  20 mg Oral Daily  . feeding supplement (ENSURE ENLIVE)  237 mL Oral TID BM  . folic acid  1 mg Intravenous Daily  . nystatin  5 mL Oral QID  . sertraline  25 mg Oral Daily  . thiamine  100 mg Intravenous Daily   Continuous Infusions: . dextrose 5 % and 0.45% NaCl      Time spent: 25 min     Marinda Elk  [facility] Pager [phone]  *Please refer to [email or web address], password [email or web address] to get updated schedule on who will round on this patient, as hospitalists switch teams weekly. If 7PM-7AM, please contact night-coverage at [email or web address], password [email or web address] for any overnight needs.  10/19/2014, 9:30 AM

## 2014-10-20 DIAGNOSIS — E440 Moderate protein-calorie malnutrition: Secondary | ICD-10-CM

## 2014-10-20 LAB — CBC
HEMATOCRIT: 40.8 % (ref 36.0–46.0)
HEMOGLOBIN: 13.6 g/dL (ref 12.0–15.0)
MCH: 29.6 pg (ref 26.0–34.0)
MCHC: 33.3 g/dL (ref 30.0–36.0)
MCV: 88.9 fL (ref 78.0–100.0)
Platelets: 296 10*3/uL (ref 150–400)
RBC: 4.59 MIL/uL (ref 3.87–5.11)
RDW: 13.4 % (ref 11.5–15.5)
WBC: 8 10*3/uL (ref 4.0–10.5)

## 2014-10-20 LAB — BASIC METABOLIC PANEL
ANION GAP: 5 (ref 5–15)
BUN: 9 mg/dL (ref 6–20)
CALCIUM: 8.2 mg/dL — AB (ref 8.9–10.3)
CHLORIDE: 106 mmol/L (ref 101–111)
CO2: 26 mmol/L (ref 22–32)
Creatinine, Ser: 0.67 mg/dL (ref 0.44–1.00)
GFR calc non Af Amer: >60 mL/min (ref >60)
GLUCOSE: 172 mg/dL — AB (ref 65–99)
POTASSIUM: 3.9 mmol/L (ref 3.5–5.1)
Sodium: 137 mmol/L (ref 135–145)

## 2014-10-20 LAB — GLUCOSE, CAPILLARY
GLUCOSE-CAPILLARY: 132 mg/dL — AB (ref 65–99)
GLUCOSE-CAPILLARY: 172 mg/dL — AB (ref 65–99)
Glucose-Capillary: 102 mg/dL — ABNORMAL HIGH (ref 65–99)

## 2014-10-20 MED ORDER — MEMANTINE HCL 5 MG PO TABS
5.0000 mg | ORAL_TABLET | Freq: Two times a day (BID) | ORAL | Status: DC
  Administered 2014-10-20 – 2014-10-21 (×3): 5 mg via ORAL
  Filled 2014-10-20 (×4): qty 1

## 2014-10-20 MED ORDER — LIP MEDEX EX OINT
TOPICAL_OINTMENT | CUTANEOUS | Status: AC
Start: 2014-10-20 — End: 2014-10-20
  Administered 2014-10-20: 14:00:00
  Filled 2014-10-20: qty 7

## 2014-10-20 NOTE — Progress Notes (Signed)
Initial Nutrition Assessment  DOCUMENTATION CODES:    Non-severe (moderate) malnutrition in context of acute illness/injury  INTERVENTION:  1.  General healthful diet; encourage intake of foods and beverages as able.  RD to follow and assess for nutritional adequacy.  2.  Goals of care; consider establishing goals of care.  Patient is refusing interventions and continues to refuse to eat PO.   NUTRITION DIAGNOSIS:   Inadequate oral intake related to lethargy/confusion as evidenced by meal completion < 50%.  GOAL:    Patient eating PO, meeting >90% of estimated needs.  MONITOR:    PO intake, weight, labs  REASON FOR ASSESSMENT: Consult, assessment of nutrition status.      ASSESSMENT:   Patient has been suffering from major depressive disorder with decreased by mouth intake. Patient have frequent admission to medical service as well as behavioral health. Most recent on 28th of September she was transferred from behavioral health to [facility] because patient almost syncopized secondary to poor by mouth intake. She had positive orthostatics and admissions. At that time she was found to have a urinary tract infection urine culture grew multiple organisms but was nonspecific and completed antibiotics therapy. Patient was admitted for IV rehydration and at first did well.  RD consulted due to poor PO intake.  Pt is somnolent in bed.  Sitter at bedside states that patient has been OOB x1 today, but otherwise asleep and difficult to arouse.  Patient with AMS, refusing care, refusing to eat.  Patient was recently assessed by RD during [facility] admission and found the poor PO intake. Weight has fluctuated between 119-124 lbs for the past 6 months despite poor intake.   Nutrition Focused Physical Exam: Subcutaneous Fat:  Orbital Region: edema present Upper Arm Region: mild wasting Thoracic and Lumbar Region: no wasting identified  Muscle:  Temple Region: moderate wasting Clavicle  Bone Region: moderate wasting Clavicle and Acromion Bone Region: moderate wasting Scapular Bone Region: unable to assess Dorsal Hand: no wasting identified Patellar Region: moderate-severe Anterior Thigh Region: moderate-severe Posterior Calf Region: moderate  Edema: none present  Diet Order:  Diet Heart Room service appropriate?: Yes; Fluid consistency:: Thin  Skin:     Last BM:  10/2  Height:   Ht Readings from Last 1 Encounters:  10/18/14 5' (1.524 m)    Weight:   Wt Readings from Last 1 Encounters:  10/18/14 119 lb (53.978 kg)    Ideal Body Weight:   100 lbs  BMI:  Body mass index is 23.24 kg/(m^2).  Estimated Nutritional Needs:   Kcal:  1375-1540  Protein:  40-45g  Fluid:  >1.5 L/day  EDUCATION NEEDS: Not appropriate for education at this time     Weekend RD coverage:  Loyce Dys, MS RD LDN Weekend/After hours pager: [phone]

## 2014-10-20 NOTE — Progress Notes (Signed)
[facility] PROGRESS NOTE    Progress Note   Barbara Beck GNF:[id number] DOB: 12-01-1927 DOA: 10/18/2014 PCP: Lora Paula, MD   Brief Narrative:   Barbara Beck is an 81 y.o. female   Assessment/Plan:  Metabolic encephalopathy/acute confusional state versus underlying dementia: - She was started on IV fluid hydration she is hard to arouse today again - We'll have d/c narcotics, will continue Zoloft. - She might be developing some kind of cognitive impairment due to the change of environment at her age the fact that she's cannot speak the language makes an anonymous person this age. - Haldol when necessary for agitation.   Essential Hypertension: - Continue Norvasc blood pressure seems to be stable.    Major depressive disorder, recurrent, severe without psychotic features: Continue Zoloft.  Dehydration: - Continue IV fluids.  Moderate malnutrition: - Continue ensure.  Hypoglycemia: None while in the hospital continue to monitor CBGs.    DVT Prophylaxis - Lovenox ordered.  Family Communication: none Disposition Plan: Home when stable. Code Status:     Code Status Orders        Start     Ordered   10/18/14 2307  Full code   Continuous     10/18/14 2308        IV Access:    Peripheral IV   Procedures and diagnostic studies:   Dg Chest 1 View  10/19/2014   CLINICAL DATA:  Confusion  EXAM: CHEST 1 VIEW  COMPARISON:  10/09/2014  FINDINGS: Improved lung volume since the prior study. Mild left lower lobe atelectasis has improved. Negative for heart failure or effusion.  IMPRESSION: Improved aeration of lungs. Mild left lower lobe atelectasis remains.   Electronically Signed   By: Marlan Palau M.D.   On: 10/19/2014 10:08   Ct Head Wo Contrast  10/19/2014   CLINICAL DATA:  81 year old female with altered mental status and behavioral changes.  EXAM: CT HEAD WITHOUT CONTRAST  TECHNIQUE: Contiguous axial images were obtained from the base of the  skull through the vertex without intravenous contrast.  COMPARISON:  10/09/2014 CT and MR.  FINDINGS: Mild chronic small-vessel white matter ischemic changes are again identified.  No acute intracranial abnormalities are identified, including mass lesion or mass effect, hydrocephalus, extra-axial fluid collection, midline shift, hemorrhage, or acute infarction.  The visualized bony calvarium is unremarkable.  IMPRESSION: No evidence of acute intracranial abnormality.  Mild chronic small-vessel white matter ischemic changes.   Electronically Signed   By: Harmon Pier M.D.   On: 10/19/2014 11:29     Medical Consultants:    None.  Anti-Infectives:   Anti-infectives    None      Subjective:    Barbara Beck is hard to arouse does not want to talk.  Objective:    Filed Vitals:   10/19/14 1600 10/19/14 1958 10/20/14 0012 10/20/14 0416  BP: 168/60 125/59 114/53 131/61  Pulse: 66 65 71 66  Temp: 98.5 F (36.9 C) 98 F (36.7 C) 98.1 F (36.7 C) 97.6 F (36.4 C)  TempSrc: Oral Oral Oral Axillary  Resp: Height:      Weight:      SpO2: 100% 97% 100% 98%    Intake/Output Summary (Last 24 hours) at 10/20/14 0912 Last data filed at 10/20/14 0859  Gross per 24 hour  Intake    480 ml  Output      0 ml  Net    480 ml   Filed  Weights   10/18/14 2042  Weight: 53.978 kg (119 lb)    Exam: Gen:  NAD Cardiovascular:  RRR, No M/R/G Respiratory:  Lungs CTAB Gastrointestinal:  Abdomen soft, NT/ND, + BS Extremities:  No C/E/C   Data Reviewed:    Labs: Basic Metabolic Panel:  Recent Labs Lab 10/13/14 1617 10/18/14 2120 10/19/14 1444  NA 135  --  135  K 3.4*  --  3.4*  CL 101  --  101  CO2 27  --  26  GLUCOSE 171*  --  130*  BUN 10  --  12  CREATININE 0.69  --  0.73  CALCIUM 8.3*  --  8.7*  MG  --  2.1 2.0  PHOS  --  4.4 3.7   GFR Estimated Creatinine Clearance: 36.9 mL/min (by C-G formula based on Cr of 0.73). Liver Function Tests:  Recent  Labs Lab 10/19/14 1444  AST 26  ALT 43  ALKPHOS 82  BILITOT 0.8  PROT 7.0  ALBUMIN 3.4*   No results for input(s): LIPASE, AMYLASE in the last 168 hours. No results for input(s): AMMONIA in the last 168 hours. Coagulation profile No results for input(s): INR, PROTIME in the last 168 hours.  CBC:  Recent Labs Lab 10/18/14 2120 10/19/14 1444  WBC 10.0 10.1  NEUTROABS 6.9  --   HGB 13.8 14.9  HCT 41.0 43.6  MCV 88.6 87.9  PLT 301 251   Cardiac Enzymes:  Recent Labs Lab 10/18/14 2120  CKTOTAL 50  TROPONINI <0.03   BNP (last 3 results) No results for input(s): PROBNP in the last 8760 hours. CBG:  Recent Labs Lab 10/18/14 2201 10/19/14 0006 10/19/14 1752 10/20/14 0014 10/20/14 0558  GLUCAP 82 95 234* 172* 102*   D-Dimer: No results for input(s): DDIMER in the last 72 hours. Hgb A1c: No results for input(s): HGBA1C in the last 72 hours. Lipid Profile: No results for input(s): CHOL, HDL, LDLCALC, TRIG, CHOLHDL, LDLDIRECT in the last 72 hours. Thyroid function studies: No results for input(s): TSH, T4TOTAL, T3FREE, THYROIDAB in the last 72 hours.  Invalid input(s): FREET3 Anemia work up:  Recent Labs  10/19/14 1444  VITAMINB12 705  FOLATE 14.4   Sepsis Labs:  Recent Labs Lab 10/18/14 2120 10/19/14 1444  WBC 10.0 10.1  LATICACIDVEN 0.8  --    Microbiology No results found for this or any previous visit (from the past 240 hour(s)).   Medications:   . amLODipine  10 mg Oral Daily  . donepezil  5 mg Oral QHS  . famotidine  20 mg Oral Daily  . feeding supplement (ENSURE ENLIVE)  237 mL Oral TID BM  . folic acid  1 mg Intravenous Daily  . nystatin  5 mL Oral QID  . sertraline  25 mg Oral Daily  . thiamine  100 mg Intravenous Daily   Continuous Infusions: . dextrose 5 % and 0.45% NaCl 1,000 mL with potassium chloride 40 mEq infusion 75 mL/hr at 10/19/14 1627    Time spent: 25 min     Barbara Beck  [facility] Pager  [phone]  *Please refer to [email or web address], password [email or web address] to get updated schedule on who will round on this Beck, as hospitalists switch teams weekly. If 7PM-7AM, please contact night-coverage at [email or web address], password [email or web address] for any overnight needs.  10/20/2014, 9:12 AM

## 2014-10-21 LAB — BASIC METABOLIC PANEL
Anion gap: 8 (ref 5–15)
BUN: 9 mg/dL (ref 6–20)
CO2: 26 mmol/L (ref 22–32)
CREATININE: 0.52 mg/dL (ref 0.44–1.00)
Calcium: 8.8 mg/dL — ABNORMAL LOW (ref 8.9–10.3)
Chloride: 105 mmol/L (ref 101–111)
GFR calc Af Amer: >60 mL/min (ref >60)
GLUCOSE: 109 mg/dL — AB (ref 65–99)
POTASSIUM: 4.2 mmol/L (ref 3.5–5.1)
SODIUM: 139 mmol/L (ref 135–145)

## 2014-10-21 LAB — CBC
HEMATOCRIT: 42.5 % (ref 36.0–46.0)
Hemoglobin: 14.2 g/dL (ref 12.0–15.0)
MCH: 29.5 pg (ref 26.0–34.0)
MCHC: 33.4 g/dL (ref 30.0–36.0)
MCV: 88.2 fL (ref 78.0–100.0)
PLATELETS: 300 10*3/uL (ref 150–400)
RBC: 4.82 MIL/uL (ref 3.87–5.11)
RDW: 13.5 % (ref 11.5–15.5)
WBC: 9.4 10*3/uL (ref 4.0–10.5)

## 2014-10-21 MED ORDER — FOLIC ACID 1 MG PO TABS
1.0000 mg | ORAL_TABLET | Freq: Every day | ORAL | Status: DC
  Administered 2014-10-21: 1 mg via ORAL
  Filled 2014-10-21: qty 1

## 2014-10-21 MED ORDER — DONEPEZIL HCL 5 MG PO TABS: 5.0000 mg | ORAL_TABLET | Freq: Every day | ORAL | Status: AC

## 2014-10-21 MED ORDER — SERTRALINE HCL 25 MG PO TABS: 25.0000 mg | ORAL_TABLET | Freq: Every day | ORAL | Status: DC

## 2014-10-21 MED ORDER — VITAMIN B-1 100 MG PO TABS
100.0000 mg | ORAL_TABLET | Freq: Every day | ORAL | Status: DC
  Administered 2014-10-21: 100 mg via ORAL
  Filled 2014-10-21: qty 1

## 2014-10-21 MED ORDER — MEMANTINE HCL 5 MG PO TABS: 5.0000 mg | ORAL_TABLET | Freq: Two times a day (BID) | ORAL | Status: AC

## 2014-10-21 NOTE — Progress Notes (Signed)
The patient is receiving Thiamine, Folic Acid by the intravenous route.  Based on criteria approved by the Pharmacy and Therapeutics Committee and the Medical Executive Committee, the medication is being converted to the equivalent oral dose form.  These criteria include: -No Active GI bleeding -Able to tolerate diet of full liquids (or better) or tube feeding OR able to tolerate other medications by the oral or enteral route  If you have any questions about this conversion, please contact the Pharmacy Department (ext [phone]).  Thank you.  Elson Clan, [facility] 10/21/2014 7:54 AM

## 2014-10-21 NOTE — Progress Notes (Signed)
Discharge instructions reviewed with the patient and her daughter in law, Mare Loan.  Prescriptions given.  Patient discharged to home.

## 2014-10-21 NOTE — Discharge Summary (Signed)
Physician Discharge Summary  Barbara Beck WUJ:[id number] DOB: [birth year] DOA: 10/18/2014  PCP: Lora Paula, MD  Admit date: 10/18/2014 Discharge date: 10/21/2014  Time spent: 35 minutes  Recommendations for Outpatient Follow-up:  1. Follow-up with primary care doctor as tolerated. 2. Palliative care to follow in the community  Discharge Diagnoses:  Principal Problem:   Major depressive disorder, recurrent, severe without psychotic features (HCC) Active Problems:   Hypertension   Depression   Metabolic encephalopathy   Dehydration   Moderate malnutrition (HCC)   Hypoglycemia   Discharge Condition: guarded  Diet recommendation: regular  Filed Weights   10/18/14 2042  Weight: 53.978 kg (119 lb)    History of present illness:  81 year old female with past medical history of essential hypertension and suicidal ideation suffering from major depressive disorder that is transfer from behavioral health because of not eating and drinking.   Hospital Course:  Metabolic encephalopathy/acute confusional state with underlying dementia without behavioral disturbances: Due to the patient's dehydration she was started on IV fluid. Narcotics were not initiated she was continue Zoloft, due to her history of having behavioral disturbances greater than 3 years ago before coming to [place], [place] and Arice[place] for started. It was discussed with the family the need to discuss goals of care. The daughter-in-law seems to have a good grasp in the situation as her father had advanced dementia when he passed. But the other family members are struggling with it. Palliative care to meet with family in the community to arrange goals of care.  Essential hypertension: No changes were made continue current medications.  Major depressive disorder severe without psychotic features and recurrent. Continue current regimen no changes were made.  Dehydration: This resolve with IV  fluids.  Moderate protein caloric malnutrition: Continue ensure 3 times a day.    Procedures:  CT head (i.e. Studies not automatically included, echos, thoracentesis, etc; not x-rays)  Consultations:  None  Discharge Exam: Filed Vitals:   10/21/14 0612  BP: 141/58  Pulse: 58  Temp: 97.9 F (36.6 C)  Resp: 16    General: Weak alert and oriented 3 Cardiovascular: Regular rate and rhythm Respiratory: Good air movement clear to auscultation  Discharge Instructions   Discharge Instructions    Diet - low sodium heart healthy    Complete by:  As directed      Increase activity slowly    Complete by:  As directed           Current Discharge Medication List    START taking these medications   Details  donepezil (ARICEPT) 5 MG tablet Take 1 tablet (5 mg total) by mouth at bedtime. Qty: 30 tablet, Refills: 3    memantine (NAMENDA) 5 MG tablet Take 1 tablet (5 mg total) by mouth 2 (two) times daily. Qty: 60 tablet, Refills: 3      CONTINUE these medications which have CHANGED   Details  sertraline (ZOLOFT) 25 MG tablet Take 1 tablet (25 mg total) by mouth daily. Qty: 30 tablet, Refills: 3      CONTINUE these medications which have NOT CHANGED   Details  amLODipine (NORVASC) 10 MG tablet Take 1 tablet (10 mg total) by mouth daily. Qty: 90 tablet, Refills: 3    Calcium Carb-Cholecalciferol (CALCIUM-VITAMIN D) 600-400 MG-UNIT TABS Take 1 tablet by mouth daily.     feeding supplement, ENSURE ENLIVE, (ENSURE ENLIVE) LIQD Take 237 mLs by mouth 3 (three) times daily between meals. Qty: 237 mL, Refills: 12  fluticasone (FLONASE) 50 MCG/ACT nasal spray Place 2 sprays into both nostrils daily. Qty: 16 g, Refills: 6   Associated Diagnoses: Allergic rhinitis, unspecified allergic rhinitis type    acetaminophen (TYLENOL) 325 MG tablet Take 2 tablets (650 mg total) by mouth 3 (three) times daily as needed for moderate pain. Qty: 50 tablet, Refills: 1     cyanocobalamin 500 MCG tablet Take 1 tablet (500 mcg total) by mouth daily. Qty: 90 tablet, Refills: 3    nystatin (MYCOSTATIN) 100000 UNIT/ML suspension Take 5 mLs (500,000 Units total) by mouth 4 (four) times daily. Qty: 60 mL, Refills: 0    ranitidine (ZANTAC) 150 MG tablet TAKE 1 TABLET BY MOUTH 2 TIMES DAILY Qty: 60 tablet, Refills: 2      STOP taking these medications     pyridOXINE (B-6) 50 MG tablet      Thiamine 50 MG CAPS        Allergies  Allergen Reactions  . Aspirin Hives and Nausea Only    NOTHING THAT CONTAINS ASPIRIN   Follow-up Information    Follow up with Lora Paula, MD In 3 weeks.   Specialty:  Family Medicine   Why:  Hospital follow-up with PCP titrate dementia medications as tolerated.   Contact information:   [address] [place] [address] [phone]        The results of significant diagnostics from this hospitalization (including imaging, microbiology, ancillary and laboratory) are listed below for reference.    Significant Diagnostic Studies: Dg Chest 1 View  10/19/2014   CLINICAL DATA:  Confusion  EXAM: CHEST 1 VIEW  COMPARISON:  10/09/2014  FINDINGS: Improved lung volume since the prior study. Mild left lower lobe atelectasis has improved. Negative for heart failure or effusion.  IMPRESSION: Improved aeration of lungs. Mild left lower lobe atelectasis remains.   Electronically Signed   By: Marlan Palau M.D.   On: 10/19/2014 10:08   Dg Chest 2 View  10/06/2014   CLINICAL DATA:  History of hypertension, psychiatric placement  EXAM: CHEST  2 VIEW  COMPARISON:  09/19/2014  FINDINGS: Mild cardiac enlargement stable. Stable aortic arch calcification. Vascular pattern normal. Mild chronic bronchitic change. No consolidation or effusion.  IMPRESSION: No active cardiopulmonary disease.   Electronically Signed   By: Esperanza Heir M.D.   On: 10/06/2014 13:54   Ct Head Wo Contrast  10/19/2014   CLINICAL DATA:  81 year old female  with altered mental status and behavioral changes.  EXAM: CT HEAD WITHOUT CONTRAST  TECHNIQUE: Contiguous axial images were obtained from the base of the skull through the vertex without intravenous contrast.  COMPARISON:  10/09/2014 CT and MR.  FINDINGS: Mild chronic small-vessel white matter ischemic changes are again identified.  No acute intracranial abnormalities are identified, including mass lesion or mass effect, hydrocephalus, extra-axial fluid collection, midline shift, hemorrhage, or acute infarction.  The visualized bony calvarium is unremarkable.  IMPRESSION: No evidence of acute intracranial abnormality.  Mild chronic small-vessel white matter ischemic changes.   Electronically Signed   By: Harmon Pier M.D.   On: 10/19/2014 11:29   Ct Head Wo Contrast  10/09/2014   CLINICAL DATA:  Acute onset of unresponsiveness. Excessive drooling with fall. Initial encounter.  EXAM: CT HEAD WITHOUT CONTRAST  TECHNIQUE: Contiguous axial images were obtained from the base of the skull through the vertex without intravenous contrast.  COMPARISON:  None.  FINDINGS: There is no evidence of acute intracranial hemorrhage, mass lesion, brain edema or extra-axial fluid  collection. The ventricles and subarachnoid spaces are appropriately sized for age. There is no CT evidence of acute cortical infarction. There is evidence of minimal chronic periventricular white matter disease. Intracranial vascular calcifications are noted.  The visualized paranasal sinuses, mastoid air cells and middle ears are clear. The calvarium is intact.  IMPRESSION: No acute intracranial findings or explanation for the patient's symptoms.   Electronically Signed   By: Carey Bullocks M.D.   On: 10/09/2014 11:59   Mr Brain Wo Contrast  10/09/2014   CLINICAL DATA:  Acutely unresponsive. Initial encounter. Patient fell.  EXAM: MRI HEAD WITHOUT CONTRAST  TECHNIQUE: Multiplanar, multiecho pulse sequences of the brain and surrounding structures were  obtained without intravenous contrast.  COMPARISON:  Head CT earlier same day  FINDINGS: Diffusion imaging does not show any acute or subacute infarction. The brainstem is normal. No cerebellar insult. Within the cerebral hemispheres, there are mild chronic small-vessel ischemic changes within the deep white matter. No cortical or large vessel territory infarction. No mass lesion, hemorrhage, hydrocephalus or extra-axial collection. No pituitary mass. No inflammatory sinus disease. No skull or skullbase lesion.  IMPRESSION: No acute finding. Mild chronic small-vessel change of the cerebral hemispheric white matter.   Electronically Signed   By: Paulina Fusi M.D.   On: 10/09/2014 19:46   Portable Chest 1 View  10/09/2014   CLINICAL DATA:  Syncope, unresponsive for 5 minutes  EXAM: PORTABLE CHEST - 1 VIEW  COMPARISON:  10/06/2014  FINDINGS: Limited inspiratory effect. Heart size and vascular pattern are normal. Mild to moderate chronic appearing bronchitic change. No consolidation or effusion.  IMPRESSION: No active disease.   Electronically Signed   By: Esperanza Heir M.D.   On: 10/09/2014 13:55   Dg Abd 2 Views  10/09/2014   CLINICAL DATA:  81 year old female undergoing MRI clearance  EXAM: ABDOMEN - 2 VIEW  COMPARISON:  None.  FINDINGS: No abnormal metallic foreign body. The bowel gas pattern is normal. Degenerative disc disease noted at L4-L5 and L5-S1. No acute osseous lesion. No evidence of free air. Lung bases are clear.  IMPRESSION: No evidence of metallic foreign body.   Electronically Signed   By: Malachy Moan M.D.   On: 10/09/2014 16:51    Microbiology: No results found for this or any previous visit (from the past 240 hour(s)).   Labs: Basic Metabolic Panel:  Recent Labs Lab 10/18/14 2120 10/19/14 1444 10/20/14 1615 10/21/14 0515  NA  --  135 137 139  K  --  3.4* 3.9 4.2  CL  --  101 106 105  CO2  --  GLUCOSE  --  130* 172* 109*  BUN  --  CREATININE  --   0.73 0.67 0.52  CALCIUM  --  8.7* 8.2* 8.8*  MG 2.1 2.0  --   --   PHOS 4.4 3.7  --   --    Liver Function Tests:  Recent Labs Lab 10/19/14 1444  AST 26  ALT 43  ALKPHOS 82  BILITOT 0.8  PROT 7.0  ALBUMIN 3.4*   No results for input(s): LIPASE, AMYLASE in the last 168 hours. No results for input(s): AMMONIA in the last 168 hours. CBC:  Recent Labs Lab 10/18/14 2120 10/19/14 1444 10/20/14 1615 10/21/14 0515  WBC 10.0 10.1 8.0 9.4  NEUTROABS 6.9  --   --   --   HGB 13.8 14.9 13.6 14.2  HCT 41.0 43.6 40.8 42.5  MCV 88.6 87.9 88.9 88.2  PLT 301 251 296 300   Cardiac Enzymes:  Recent Labs Lab 10/18/14 2120  CKTOTAL 50  TROPONINI <0.03   BNP: BNP (last 3 results)  Recent Labs  09/19/14 1950  BNP 30.1    ProBNP (last 3 results) No results for input(s): PROBNP in the last 8760 hours.  CBG:  Recent Labs Lab 10/19/14 0006 10/19/14 1752 10/20/14 0014 10/20/14 0558 10/20/14 2230  GLUCAP 95 234* 172* 102* 132*       Signed:  FELIZ ORTIZ, ABRAHAM  [facility] 10/21/2014, 8:44 AM

## 2014-10-22 ENCOUNTER — Emergency Department (HOSPITAL_COMMUNITY)
Admission: EM | Admit: 2014-10-22 | Discharge: 2014-10-23 | Disposition: A | Discharge status: Home / Self Care | Payer: Self-pay | Attending: Emergency Medicine | Admitting: Emergency Medicine

## 2014-10-22 ENCOUNTER — Other Ambulatory Visit: Payer: Self-pay

## 2014-10-22 ENCOUNTER — Encounter (HOSPITAL_COMMUNITY): Payer: Self-pay

## 2014-10-22 ENCOUNTER — Emergency Department (HOSPITAL_COMMUNITY): Payer: Self-pay

## 2014-10-22 DIAGNOSIS — F332 Major depressive disorder, recurrent severe without psychotic features: Secondary | ICD-10-CM | POA: Diagnosis present

## 2014-10-22 DIAGNOSIS — Z87891 Personal history of nicotine dependence: Secondary | ICD-10-CM | POA: Insufficient documentation

## 2014-10-22 DIAGNOSIS — Z01818 Encounter for other preprocedural examination: Secondary | ICD-10-CM

## 2014-10-22 DIAGNOSIS — Z7951 Long term (current) use of inhaled steroids: Secondary | ICD-10-CM | POA: Insufficient documentation

## 2014-10-22 DIAGNOSIS — K219 Gastro-esophageal reflux disease without esophagitis: Secondary | ICD-10-CM | POA: Insufficient documentation

## 2014-10-22 DIAGNOSIS — I10 Essential (primary) hypertension: Secondary | ICD-10-CM | POA: Insufficient documentation

## 2014-10-22 DIAGNOSIS — F419 Anxiety disorder, unspecified: Secondary | ICD-10-CM | POA: Insufficient documentation

## 2014-10-22 DIAGNOSIS — Z79899 Other long term (current) drug therapy: Secondary | ICD-10-CM | POA: Insufficient documentation

## 2014-10-22 DIAGNOSIS — Z0289 Encounter for other administrative examinations: Secondary | ICD-10-CM | POA: Insufficient documentation

## 2014-10-22 DIAGNOSIS — F329 Major depressive disorder, single episode, unspecified: Secondary | ICD-10-CM | POA: Insufficient documentation

## 2014-10-22 LAB — CBC WITH DIFFERENTIAL/PLATELET
BASOS PCT: 0 %
Basophils Absolute: 0 10*3/uL (ref 0.0–0.1)
EOS ABS: 0.1 10*3/uL (ref 0.0–0.7)
Eosinophils Relative: 1 %
HCT: 42.4 % (ref 36.0–46.0)
Hemoglobin: 14.2 g/dL (ref 12.0–15.0)
Lymphocytes Relative: 33 %
Lymphs Abs: 3.1 10*3/uL (ref 0.7–4.0)
MCH: 29.5 pg (ref 26.0–34.0)
MCHC: 33.5 g/dL (ref 30.0–36.0)
MCV: 88.1 fL (ref 78.0–100.0)
MONO ABS: 0.5 10*3/uL (ref 0.1–1.0)
MONOS PCT: 6 %
Neutro Abs: 5.8 10*3/uL (ref 1.7–7.7)
Neutrophils Relative %: 60 %
PLATELETS: 297 10*3/uL (ref 150–400)
RBC: 4.81 MIL/uL (ref 3.87–5.11)
RDW: 13.4 % (ref 11.5–15.5)
WBC: 9.5 10*3/uL (ref 4.0–10.5)

## 2014-10-22 LAB — COMPREHENSIVE METABOLIC PANEL
ALBUMIN: 3.5 g/dL (ref 3.5–5.0)
ALT: 38 U/L (ref 14–54)
ANION GAP: 6 (ref 5–15)
AST: 23 U/L (ref 15–41)
Alkaline Phosphatase: 88 U/L (ref 38–126)
BILIRUBIN TOTAL: 0.4 mg/dL (ref 0.3–1.2)
BUN: 13 mg/dL (ref 6–20)
CO2: 28 mmol/L (ref 22–32)
Calcium: 8.6 mg/dL — ABNORMAL LOW (ref 8.9–10.3)
Chloride: 104 mmol/L (ref 101–111)
Creatinine, Ser: 0.58 mg/dL (ref 0.44–1.00)
GFR calc Af Amer: >60 mL/min (ref >60)
GFR calc non Af Amer: >60 mL/min (ref >60)
GLUCOSE: 79 mg/dL (ref 65–99)
POTASSIUM: 3.7 mmol/L (ref 3.5–5.1)
SODIUM: 138 mmol/L (ref 135–145)
TOTAL PROTEIN: 7.1 g/dL (ref 6.5–8.1)

## 2014-10-22 LAB — ETHANOL: Alcohol, Ethyl (B): <5 mg/dL (ref <5)

## 2014-10-22 MED ORDER — MEMANTINE HCL 5 MG PO TABS
5.0000 mg | ORAL_TABLET | Freq: Two times a day (BID) | ORAL | Status: DC
  Administered 2014-10-22 – 2014-10-23 (×2): 5 mg via ORAL
  Filled 2014-10-22 (×3): qty 1

## 2014-10-22 MED ORDER — LISINOPRIL 20 MG PO TABS
20.0000 mg | ORAL_TABLET | Freq: Every day | ORAL | Status: DC
  Administered 2014-10-22 – 2014-10-23 (×2): 20 mg via ORAL
  Filled 2014-10-22 (×2): qty 1

## 2014-10-22 MED ORDER — AMLODIPINE BESYLATE 10 MG PO TABS
10.0000 mg | ORAL_TABLET | Freq: Every day | ORAL | Status: DC
  Administered 2014-10-22 – 2014-10-23 (×2): 10 mg via ORAL
  Filled 2014-10-22 (×2): qty 1

## 2014-10-22 MED ORDER — SERTRALINE HCL 50 MG PO TABS
25.0000 mg | ORAL_TABLET | Freq: Every day | ORAL | Status: DC
  Administered 2014-10-22 – 2014-10-23 (×2): 25 mg via ORAL
  Filled 2014-10-22 (×2): qty 1

## 2014-10-22 MED ORDER — DONEPEZIL HCL 5 MG PO TABS
5.0000 mg | ORAL_TABLET | Freq: Every day | ORAL | Status: DC
  Administered 2014-10-22: 5 mg via ORAL
  Filled 2014-10-22 (×2): qty 1

## 2014-10-22 MED ORDER — ACETAMINOPHEN 325 MG PO TABS: 650.0000 mg | ORAL_TABLET | Freq: Three times a day (TID) | ORAL | Status: DC | PRN

## 2014-10-22 MED ORDER — CYANOCOBALAMIN 500 MCG PO TABS
500.0000 ug | ORAL_TABLET | Freq: Every day | ORAL | Status: DC
  Administered 2014-10-22 – 2014-10-23 (×2): 500 ug via ORAL
  Filled 2014-10-22 (×2): qty 1

## 2014-10-22 MED ORDER — HYDROCHLOROTHIAZIDE 25 MG PO TABS
25.0000 mg | ORAL_TABLET | Freq: Every day | ORAL | Status: DC
  Administered 2014-10-23: 25 mg via ORAL
  Filled 2014-10-22: qty 1

## 2014-10-22 MED ORDER — FAMOTIDINE 20 MG PO TABS
20.0000 mg | ORAL_TABLET | Freq: Two times a day (BID) | ORAL | Status: DC
  Administered 2014-10-23: 20 mg via ORAL
  Filled 2014-10-22: qty 1

## 2014-10-22 MED ORDER — LISINOPRIL-HYDROCHLOROTHIAZIDE 20-25 MG PO TABS: 1.0000 | ORAL_TABLET | Freq: Every day | ORAL | Status: DC

## 2014-10-22 NOTE — ED Notes (Signed)
Personal belongings to locker 29.  Walking cane labeled and at nurses desk.

## 2014-10-22 NOTE — ED Provider Notes (Signed)
CSN: [id number]     Arrival date & time 10/22/14  1308 History   First MD Initiated Contact with Patient 10/22/14 1315       [employer] translator services utilized  HPI Patient presents to the emergency department complaining of increasing sadness and a desire not to live.  She's had thoughts of suicide but does not have a plan.  She is unable to tell me why she became more sadder the past several days.  I attempted to call her daughter-in-law however there is no response with the number given.  Translator services were utilized to obtain history.  Patient denies chest pain shortness of breath.  No nausea vomiting or diarrhea.  She denies hallucinations.  Recent evaluation by psychiatric team in September 2016 for severe depression    Past Medical History  Diagnosis Date  . Hypertension ?   Marland Kitchen GERD (gastroesophageal reflux disease) ?   Marland Kitchen Suicidal ideation   . Anxiety   . Depression    Past Surgical History  Procedure Laterality Date  . Eye surgery Left     Cataract Surgery    Family History  Problem Relation Age of Onset  . Cancer Daughter     breast with bone mets   . Hypertension Father    Social History  Substance Use Topics  . Smoking status: Former Smoker -- 0.00 packs/day for 13 years    Types: Cigarettes    Quit date: 01/19/1984  . Smokeless tobacco: Never Used  . Alcohol Use: No     Comment: denied   OB History    No data available     Review of Systems  All other systems reviewed and are negative.     Allergies  Aspirin  Home Medications   Prior to Admission medications   Medication Sig Start Date End Date Taking? Authorizing Provider  acetaminophen (TYLENOL) 325 MG tablet Take 2 tablets (650 mg total) by mouth 3 (three) times daily as needed for moderate pain. 03/11/14   Josalyn Funches, MD  amLODipine (NORVASC) 10 MG tablet Take 1 tablet (10 mg total) by mouth daily. 08/08/14   Josalyn Funches, MD  Calcium Carb-Cholecalciferol  (CALCIUM-VITAMIN D) 600-400 MG-UNIT TABS Take 1 tablet by mouth daily.     Historical Provider, MD  cyanocobalamin 500 MCG tablet Take 1 tablet (500 mcg total) by mouth daily. 02/28/14   Josalyn Funches, MD  donepezil (ARICEPT) 5 MG tablet Take 1 tablet (5 mg total) by mouth at bedtime. 10/21/14   Marinda Elk, MD  feeding supplement, ENSURE ENLIVE, (ENSURE ENLIVE) LIQD Take 237 mLs by mouth 3 (three) times daily between meals. 10/16/14   Belkys A Regalado, MD  fluticasone (FLONASE) 50 MCG/ACT nasal spray Place 2 sprays into both nostrils daily. 01/29/14   Josalyn Funches, MD  memantine (NAMENDA) 5 MG tablet Take 1 tablet (5 mg total) by mouth 2 (two) times daily. 10/21/14   Marinda Elk, MD  nystatin (MYCOSTATIN) 100000 UNIT/ML suspension Take 5 mLs (500,000 Units total) by mouth 4 (four) times daily. 10/16/14   Belkys A Regalado, MD  ranitidine (ZANTAC) 150 MG tablet TAKE 1 TABLET BY MOUTH 2 TIMES DAILY Patient taking differently: TAKE 1 TABLET BY MOUTH AT BEDTIME 08/02/14   Josalyn Funches, MD  sertraline (ZOLOFT) 25 MG tablet Take 1 tablet (25 mg total) by mouth daily. 10/21/14   Marinda Elk, MD   BP 139/61 mmHg  Pulse 50  Temp(Src) 97.5 F (36.4 C) (Oral)  Resp 14  Wt 119 lb (53.978 kg)  SpO2 97% Physical Exam  Constitutional: She is oriented to person, place, and time. She appears well-developed and well-nourished. No distress.  HENT:  Head: Normocephalic and atraumatic.  Eyes: EOM are normal.  Neck: Normal range of motion.  Cardiovascular: Normal rate, regular rhythm and normal heart sounds.   Pulmonary/Chest: Effort normal and breath sounds normal.  Abdominal: Soft. She exhibits no distension. There is no tenderness.  Musculoskeletal: Normal range of motion.  Neurological: She is alert and oriented to person, place, and time.  Skin: Skin is warm and dry.  Psychiatric:  Flat affect.  Soft-spoken.  Suicidal thoughts  Nursing note and vitals reviewed.   ED Course   Procedures (including critical care time) Labs Review Labs Reviewed  CBC WITH DIFFERENTIAL/PLATELET  COMPREHENSIVE METABOLIC PANEL  URINALYSIS, ROUTINE W REFLEX MICROSCOPIC (NOT AT [facility])  ETHANOL  URINE RAPID DRUG SCREEN, HOSP PERFORMED    Imaging Review No results found. I have personally reviewed and evaluated these images and lab results as part of my medical decision-making.   EKG Interpretation None      MDM   Final diagnoses:  None    Despite her advanced age this appears to be severe depression.  She'll benefit from psychiatric evaluation and likely stabilization.  Given her age she may benefit from geriatric psychiatric services at [facility] and thus an EKG and chest x-ray have been obtained in addition to the standard lab work.    Azalia Bilis, MD 10/22/14 [phone]

## 2014-10-22 NOTE — ED Notes (Signed)
Multiple attempts to arouse patient.  Unresponsive to this writer's attempts.  Skin w/d.  Resp even and unlabored.

## 2014-10-22 NOTE — BHH Counselor (Signed)
10/22/14 Referral packet sent to [address], [address], [place], [place], [place], [place]. [place], [facility]  [place]. Mckinley Adelstein K. [place]a Whalin, NCC, LPC-A, LCAS-A  Counselor 10/22/2014 10:37 PM

## 2014-10-22 NOTE — ED Notes (Signed)
Pt to radiology, will collect labs when pt returns.  

## 2014-10-22 NOTE — ED Notes (Signed)
This assessment is coming from patients daughter in law. Pt is non-verbal.

## 2014-10-22 NOTE — ED Notes (Signed)
Patient sister-in-law at bedside.  States patient home x1 night prior to current situation.  Has an appointment with palliative care on 10/6.  Sister-in-law giving food and fluid.  Support offered.

## 2014-10-22 NOTE — BH Assessment (Signed)
Writer informed TTS ([place]) of the consult.  

## 2014-10-22 NOTE — ED Notes (Signed)
Per [facility], pt was picked up @ [address]  Used interpreter @ the center.  Was seen here last week for a UTI, given abx but not sure if she took them all.  She lives with her ?daughter-in-law, Mare Loan, # [phone].

## 2014-10-22 NOTE — ED Notes (Addendum)
Through interpreter pt stated she did complete the abx, has lost everything here, is from [place].

## 2014-10-22 NOTE — BHH Suicide Risk Assessment (Signed)
Disregard last note: Referrals sent to [place], [facility], [address], [place], [place], [place], [place], and [place]. Larah Kuntzman K. Aniko Finnigan, NCC, LPC-A, LCAS-A  Counselor 10/22/2014 10:41 PM

## 2014-10-22 NOTE — ED Notes (Signed)
Bed: ZO10 Expected date:  Expected time:  Means of arrival:  Comments: [id number]-[id number] ams/si

## 2014-10-22 NOTE — ED Notes (Signed)
Pharmacy called to inform that tech will be up to reconcile medications. This Clinical research associate will inform oncoming shift.

## 2014-10-23 ENCOUNTER — Telehealth: Payer: Self-pay | Admitting: Family Medicine

## 2014-10-23 DIAGNOSIS — F332 Major depressive disorder, recurrent severe without psychotic features: Secondary | ICD-10-CM | POA: Diagnosis present

## 2014-10-23 MED ORDER — SERTRALINE HCL 25 MG PO TABS: 25.0000 mg | ORAL_TABLET | Freq: Every day | ORAL | Status: DC

## 2014-10-23 NOTE — BHH Suicide Risk Assessment (Cosign Needed)
Suicide Risk Assessment  Discharge Assessment   [facility] Discharge Suicide Risk Assessment   Demographic Factors:  Age 79 or older and Low socioeconomic status  Total Time spent with patient: 20 minutes  Musculoskeletal: Strength & Muscle Tone: was seen in bed during assessment Gait & Station: see above Patient leans: see above  Psychiatric Specialty Exam:     Blood pressure 122/44, pulse 61, temperature 98.4 F (36.9 C), temperature source Oral, resp. rate 16, weight 53.978 kg (119 lb), SpO2 97 %.Body mass index is 23.24 kg/(m^2).  General Appearance: Casual  Eye Contact::  Fair  Speech:  Clear and Coherent, Slow and via Spanish interpreter  Volume:  Decreased  Mood:  Depressed  Affect:  Congruent, Depressed and Flat  Thought Process:  Coherent  Orientation:  Full (Time, Place, and Person)  Thought Content:  WDL  Suicidal Thoughts:  No  Homicidal Thoughts:  No  Memory:  Immediate;   Fair Recent;   Fair Remote;   Fair  Judgement:  Fair  Insight:  Good  Psychomotor Activity:  Psychomotor Retardation  Concentration:  Good  Recall:  Poor  Fund of Knowledge:Good  Language: via Spanish interpreter  Akathisia:  NA  Handed:  Right  AIMS (if indicated):     Assets:  Desire for Improvement  ADL's:  Intact  Cognition: WNL        Has this patient used any form of tobacco in the last 30 days? (Cigarettes, Smokeless Tobacco, Cigars, and/or Pipes) N/A  Mental Status Per Nursing Assessment::   On Admission:     Current Mental Status by Physician: NA  Loss Factors: Decline in physical health  Historical Factors: NA  Risk Reduction Factors:   Living with another person, especially a relative  Continued Clinical Symptoms:  Depression:   Insomnia  Cognitive Features That Contribute To Risk:  Polarized thinking    Suicide Risk:  Minimal: No identifiable suicidal ideation.  Patients presenting with no risk factors but with morbid ruminations; may be classified as  minimal risk based on the severity of the depressive symptoms  Principal Problem: Major depressive disorder, recurrent severe without psychotic features [facility]) Discharge Diagnoses:  Patient Active Problem List   Diagnosis Date Noted  . Major depressive disorder, recurrent severe without psychotic features (HCC) [F33.2] 10/23/2014    Priority: High  . Hypoglycemia [E16.2] 10/19/2014  . Major depressive disorder, recurrent, severe without psychotic features (HCC) [F33.2]   . Moderate malnutrition (HCC) [E44.0]   . MDD (major depressive disorder), recurrent severe, without psychosis (HCC) [F33.2] 10/11/2014  . Dehydration [E86.0]   . Thrush [B37.0]   . Syncope and collapse [R55] 10/09/2014  . Syncope [R55] 10/09/2014  . Metabolic encephalopathy [G93.41] 10/09/2014  . UTI (urinary tract infection) [N39.0] 10/09/2014  . Severe recurrent major depression without psychotic features (HCC) [F33.2] 10/08/2014  . Depression [F32.9] 10/07/2014  . Poor dentition [K08.9] 08/01/2014  . Decreased visual acuity [H54.7] 08/01/2014  . Sinus bradycardia [R00.1] 04/12/2014  . Chronic diarrhea [K52.9] 03/19/2014  . Bradycardia [R00.1] 02/19/2014  . Cataract [H26.9] 02/19/2014  . Bartholin gland cyst [N75.0] 02/19/2014  . Right sided abdominal pain [R10.9] 01/29/2014  . Allergic rhinoconjunctivitis of both eyes [J30.9, H10.13] 01/29/2014  . Vitamin D deficiency [E55.9] 01/29/2014  . SOB (shortness of breath) [R06.02] 01/29/2014  . Tingling [R20.2] 01/29/2014  . Spanish speaking patient [R47.89] 01/29/2014  . Osteoarthritis of left hip [M16.12] 01/29/2014  . DJD (degenerative joint disease), lumbar [M47.816] 01/29/2014  . Umbilical hernia [K42.9] 01/29/2014  .  Hypertension [I10]   . GERD (gastroesophageal reflux disease) [K21.9]     Follow-up Information    Follow up with Lora Paula, MD. Go on 10/28/2014.   Specialty:  Family Medicine   Why:  You have a scheduled appointment with Dr Armen Pickup  on 10/28/14 at 4:45 Pm    Contact information:   [address] [place] [address] [phone]       Plan Of Care/Follow-up recommendations:  Activity:  as tolerated Diet:  Regular  Is patient on multiple antipsychotic therapies at discharge:  No   Has Patient had three or more failed trials of antipsychotic monotherapy by history:  No  Recommended Plan for Multiple Antipsychotic Therapies: NA    Talesha Ellithorpe C   PMHNP-BC 10/23/2014, 12:52 PM

## 2014-10-23 NOTE — ED Notes (Signed)
MD at bedside. 

## 2014-10-23 NOTE — Telephone Encounter (Signed)
Patient dropped off [facility] Examination Report [facility] to be filled out by Dr. Armen Pickup. Document is time-sensitive because patient was diagnosed with dementia and was released from the hospital today; she needs 24/7 care. Document was left in Dr. Armen Pickup' mail folder.

## 2014-10-23 NOTE — BH Assessment (Signed)
BHH Assessment Progress Note   Per Carolanne Grumbling, MD, this pt does not require psychiatric hospitalization at this time.  She is to be discharged from [facility] with referral information for [facility], her current outpatient provider.  This recommendation has been included in pt's discharge instructions.  Pt's nurse has been notified.  Doylene Canning, MA Triage Specialist [phone]

## 2014-10-23 NOTE — Progress Notes (Signed)
ED CM noted pt with a scheduled appt with [facility] Funches at 1645 on 10/28/14  CM entered this in f/u d/c section

## 2014-10-23 NOTE — Discharge Summary (Signed)
Physician Discharge Summary Note  Patient:  Barbara Beck is an 81 y.o., female MRN:  [id number] DOB:  1927/04/18 Patient phone:  [phone] (home)  Patient address:   [address] [place] [address],  Total Time spent with patient: 30 minutes  Date of Admission:  10/16/2014 Date of Discharge: 10/09/2014  Reason for Admission:  81 Y/O Hispanic female who was brought by her family as she had stated she wanted to kill herself. She has been with his family in [place] for the last 9 months. She states it has been hard due to the language barrier. States that her daughter in law has done everything she can to help her adapt to being here. Both her son and her daughter in law work and they do not want to leave her alone at the house. She had been going to a senior center. States that there is only one spanish speaking client that does not go that often and even this person speaks and understands English so her communicates in [place]. She has been more depressed, has lost interest in crochet in reading her Bible. States her self esteem is very low. She is tired she wants to give up. She states she had an episode of depression years ago but she endured it. Did not pursue treatment. Used prayer to get over it. 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 She states that when she was in [place] with her other son she did better as she found more people who spoke Spanish The initial assessment was as follows: They report the Patient has never been violent or threaten to harm another. They also reports the Patient has never appeared to be experiencing hallucinations or other psychosis. Patient's Daughter-in-Law reports prior to the Patient residing with them she resided with a Son in [place] for a year. Per Daughter-in-Law; Patient lived in [place] with 2 Daughters, one has cancer, prior to living in [place]. Daughter-in-law reports the Patient appetite is poor  and that she has lost 50 lbs since January 2016. She reports the Patient is not bathing daily and no completing other hygiene needs. She reports the Patient has difficulty completing ADLs because she is physically weak. Daughter-in-Law reports the Patient has crying episodes at home and at the [facility] and has reported on several occassions that she does not want to live or be a burden on them. The Daughter-in-Law reports her Son committed suicide in October 2014 and that the Patient's was sadden over his death and talked about how he should have gotten help. Daughter-in-Law reports some of the Patient's close friends have also died recently. The Son and Daughter-in-Law both reports the Patient's limited English has made it difficult for her to acclimated and socialized with others. They reports being unaware of the Patient being previously hospitalized for mental illness. The Daughter-in-Law reports the Patient went missing for days and was spending money on irrelevant things while she was living in the [place] with her Daughters. They report the Patient does not take drugs.    Principal Problem: Severe recurrent major depression without psychotic features [facility]) Discharge Diagnoses: Patient Active Problem List   Diagnosis Date Noted  . Major depressive disorder, recurrent severe without psychotic features (HCC) [F33.2] 10/23/2014  . Hypoglycemia [E16.2] 10/19/2014  . Major depressive disorder, recurrent, severe without psychotic features (HCC) [F33.2]   . Moderate malnutrition (HCC) [E44.0]   . MDD (major depressive disorder), recurrent severe, without  psychosis (HCC) [F33.2] 10/11/2014  . Dehydration [E86.0]   . Thrush [B37.0]   . Syncope and collapse [R55] 10/09/2014  . Syncope [R55] 10/09/2014  . Metabolic encephalopathy [G93.41] 10/09/2014  . UTI (urinary tract infection) [N39.0] 10/09/2014  . Severe recurrent major depression without psychotic features (HCC)  [F33.2] 10/08/2014  . Depression [F32.9] 10/07/2014  . Poor dentition [K08.8] 08/01/2014  . Decreased visual acuity [H54.7] 08/01/2014  . Sinus bradycardia [R00.1] 04/12/2014  . Chronic diarrhea [K52.9] 03/19/2014  . Bradycardia [R00.1] 02/19/2014  . Cataract [H26.9] 02/19/2014  . Bartholin gland cyst [N75.0] 02/19/2014  . Right sided abdominal pain [R10.9] 01/29/2014  . Allergic rhinoconjunctivitis of both eyes [J30.9, H10.13] 01/29/2014  . Vitamin D deficiency [E55.9] 01/29/2014  . SOB (shortness of breath) [R06.02] 01/29/2014  . Tingling [R20.2] 01/29/2014  . Spanish speaking patient [R47.89] 01/29/2014  . Osteoarthritis of left hip [M16.12] 01/29/2014  . DJD (degenerative joint disease), lumbar [M47.816] 01/29/2014  . Umbilical hernia [K42.9] 01/29/2014  . Hypertension [I10]   . GERD (gastroesophageal reflux disease) [K21.9]     Musculoskeletal: Strength & Muscle Tone: decreased Gait & Station: unsteady Patient leans: Front  Psychiatric Specialty Exam: Physical Exam  Review of Systems  Constitutional: Positive for malaise/fatigue.  HENT: Negative.   Eyes: Negative.   Respiratory: Negative.   Cardiovascular: Negative.   Gastrointestinal: Positive for diarrhea.  Genitourinary: Negative.   Musculoskeletal: Negative.        Leg pain  Skin: Negative.   Neurological: Positive for dizziness and weakness.  Endo/Heme/Allergies: Negative.   Psychiatric/Behavioral: Positive for depression.    Blood pressure 167/75, pulse 97, temperature 98.6 F (37 C), temperature source Oral, resp. rate 16, height 4\' 9"  (1.448 m), weight 54.885 kg (121 lb), SpO2 99 %.Body mass index is 26.18 kg/(m^2).  General Appearance: Disheveled  Eye Contact::  Minimal  Speech:  notcoumminicating verbally  Volume:  Decreased  Mood:  Depressed  Affect:  Restricted  Thought Process:  no spontanoues content  Orientation:  Negative  Thought Content:  no spontaneous content  Suicidal Thoughts:  Yes.   without intent/plan  Homicidal Thoughts:  No  Memory:  Negative  Judgement:  Impaired  Insight:  Lacking  Psychomotor Activity:  Decreased  Concentration:  Negative  Recall:  Negative  Fund of Knowledge:Negative  Language: Negative  Akathisia:  No  Handed:  Right  AIMS (if indicated):     Assets:  Housing Social Support  ADL's:  Impaired  Cognition: Impaired,  Moderate  Sleep:  Number of Hours: 6.75   Have you used any form of tobacco in the last 30 days? (Cigarettes, Smokeless Tobacco, Cigars, and/or Pipes): No  Has this patient used any form of tobacco in the last 30 days? (Cigarettes, Smokeless Tobacco, Cigars, and/or Pipes) No  Past Medical History:  Past Medical History  Diagnosis Date  . Hypertension ?   Marland Kitchen GERD (gastroesophageal reflux disease) ?   Marland Kitchen Suicidal ideation   . Anxiety   . Depression     Past Surgical History  Procedure Laterality Date  . Eye surgery Left     Cataract Surgery    Family History:  Family History  Problem Relation Age of Onset  . Cancer Daughter     breast with bone mets   . Hypertension Father   Son committed suicide Social History:  History  Alcohol Use No    Comment: denied     History  Drug Use No    Comment: denied all drugs, alcohol, and  tobacco    Social History   Social History  . Marital Status: Widowed    Spouse Name: N/A  . Number of Children: 5  . Years of Education: 0   Occupational History  . Retired     Social History Main Topics  . Smoking status: Former Smoker -- 0.00 packs/day for 13 years    Types: Cigarettes    Quit date: 01/19/1984  . Smokeless tobacco: Never Used  . Alcohol Use: No     Comment: denied  . Drug Use: No     Comment: denied all drugs, alcohol, and tobacco  . Sexual Activity: No   Other Topics Concern  . None   Social History Narrative   From [place].   Moved to [place] in 2014, back to [place], back to [place] 01/18/2014.    Did not attend school.    Has 4 living children. Had  5 total, one died at 42 months old.    Lives with son and daughter-in-law in [place].   Has a daughter, Barbara Beck, who is in the [place].     Past Psychiatric History: Hospitalizations: Has never been hospitalized before  Outpatient Care: Has not seen an outpatient provider  Substance Abuse Care: Denies  Self-Mutilation:Denies  Suicidal Attempts:Denies  Violent Behaviors:Denies   Risk to Self: Is patient at risk for suicide?: No Risk to Others:   Prior Inpatient Therapy:  None Prior Outpatient Therapy:  None  Level of Care:  Admission to [facility] Course:  Barbara Beck was admitted after being cleared medically wise. She was able to have a conversation with this MD in her native Spanish as recorded in the admission note.. She had some concerns with her new dentures and her ability to chew foods. She was provided with Ensure and other liquid sources of nutrition. She responded to her family that night. In the morning she would not respond verbally to the staff keeping her eyes closed. Would not eat or drink. She was not verbally communicative. She was taken to the bathtub. While in the bathtub it was noticed that she laid back she was less responsive she was tachycardic and his BP was elevated. As per nursing note it took 3 staff members to get from the tub to the Wheelchair. EMS was called to transfer to the ED  Consults:  internal medicine  Significant Diagnostic Studies:  labs: as per the chart  Discharge Vitals:   Blood pressure 167/75, pulse 97, temperature 98.6 F (37 C), temperature source Oral, resp. rate 16, height  (1.448 m), weight 54.885 kg (121 lb), SpO2 99 %. Body mass index is 26.18 kg/(m^2). Lab Results:   No results found for this or any previous visit (from the past 72 hour(s)).  Physical Findings: AIMS: Facial and Oral Movements Muscles of Facial Expression: None, normal Lips and Perioral Area: None, normal Jaw: None, normal Tongue: None,  normal,Extremity Movements Upper (arms, wrists, hands, fingers): None, normal Lower (legs, knees, ankles, toes): None, normal, Trunk Movements Neck, shoulders, hips: None, normal, Overall Severity Severity of abnormal movements (highest score from questions above): None, normal Incapacitation due to abnormal movements: None, normal Patient's awareness of abnormal movements (rate only patient's report): No Awareness, Dental Status Current problems with teeth and/or dentures?: No Does patient usually wear dentures?: No  CIWA:  CIWA-Ar Total: 0 COWS:  COWS Total Score: 1   See Psychiatric Specialty Exam and Suicide Risk Assessment completed by Attending Physician prior to discharge.  Discharge destination:  [facility]  Is patient on multiple antipsychotic therapies at discharge:  No   Has Patient had three or more failed trials of antipsychotic monotherapy by history:  No    Recommended Plan for Multiple Antipsychotic Therapies: NA     Medication List    ASK your doctor about these medications      Indication   amLODipine 10 MG tablet  Commonly known as:  NORVASC  Take 1 tablet (10 mg total) by mouth daily.      Calcium-Vitamin D 600-400 MG-UNIT Tabs  Take 1 tablet by mouth daily.      cyanocobalamin 500 MCG tablet  Take 1 tablet (500 mcg total) by mouth daily.      feeding supplement (ENSURE ENLIVE) Liqd  Take 237 mLs by mouth 3 (three) times daily between meals.      fluticasone 50 MCG/ACT nasal spray  Commonly known as:  FLONASE  Place 2 sprays into both nostrils daily.      nystatin 100000 UNIT/ML suspension  Commonly known as:  MYCOSTATIN  Take 5 mLs (500,000 Units total) by mouth 4 (four) times daily.      ranitidine 150 MG tablet  Commonly known as:  ZANTAC  TAKE 1 TABLET BY MOUTH 2 TIMES DAILY            Follow-up Information    Follow up with [facility] On 11/25/2014.   Specialty:  Professional Counselor   Why:  for your  medication management appointment with [facility].    Contact information:   [address] [place] [address] [phone]       Follow-up recommendations:  As per internal medicine  Comments: will be admitted to the medical unit and followed up by psych on consultation  Total Discharge Time: 30 min  Signed: LUGO,IRVING A 10/23/2014, 2:50 PM

## 2014-10-23 NOTE — ED Notes (Signed)
Pt sitting up watching TV in no apparent distress.

## 2014-10-23 NOTE — Discharge Instructions (Signed)
For your ongoing behavioral health needs you are advised to follow up with [facility].  New patients are seen at their walk-in clinic.  Walk-in hours are Monday - Friday from 8:00 am - 12:00 pm, and from 1:00 pm - 3:00 pm.  Walk-in patients are seen on a first come, first served basis, so try to arrive as early as possible for the best chance of being seen the same day.  There is an initial fee of $22.50: ° °     [facility] °     [address] °     [place], [place] [address] °     [phone] °

## 2014-10-23 NOTE — ED Notes (Signed)
Assisted patient to going to restroom and back to bed. Pt tolerated it well. Urine sample was unable to be collected due missing the hat in the toilet. Will attempt for another sample later.

## 2014-10-23 NOTE — BH Assessment (Signed)
Per Dr. Ladona Ridgel and Julieanne Cotton, NP, patient is ok to discharge home. Writer has contacted patient's daughter-in-law Darrel Reach) [phone] and left a voicemail. Writer has also tried to contact patient at another listed [phone]. Writer was able to contact daughter at this number.   Patient's daughter-in-law will pick patient up from the hospital approximately 6pm today.   Patient has a follow up appointment at [facility] November 25, 2014 @ 4:15pm.

## 2014-10-23 NOTE — Consult Note (Signed)
[facility] Face-to-Face Psychiatry Consult   Reason for Consult:  Increased sadness, desire to die naturally and not eating. Referring Physician:  EDP Patient Identification: Barbara Beck MRN:  [id number] Principal Diagnosis: Major depressive disorder, recurrent severe without psychotic features [facility]) Diagnosis:   Patient Active Problem List   Diagnosis Date Noted  . Major depressive disorder, recurrent severe without psychotic features ([place]) [F33.2] 10/23/2014    Priority: High  . Hypoglycemia [E16.2] 10/19/2014  . Major depressive disorder, recurrent, severe without psychotic features ([place]) [F33.2]   . Moderate malnutrition ([place]) [E44.0]   . MDD (major depressive disorder), recurrent severe, without psychosis ([place]) [F33.2] 10/11/2014  . Dehydration [E86.0]   . Thrush [B37.0]   . Syncope and collapse [R55] 10/09/2014  . Syncope [R55] 10/09/2014  . Metabolic encephalopathy [Y86.57] 10/09/2014  . UTI (urinary tract infection) [N39.0] 10/09/2014  . Severe recurrent major depression without psychotic features ([place]) [F33.2] 10/08/2014  . Depression [F32.9] 10/07/2014  . Poor dentition [K08.9] 08/01/2014  . Decreased visual acuity [H54.7] 08/01/2014  . Sinus bradycardia [R00.1] 04/12/2014  . Chronic diarrhea [K52.9] 03/19/2014  . Bradycardia [R00.1] 02/19/2014  . Cataract [H26.9] 02/19/2014  . Bartholin gland cyst [N75.0] 02/19/2014  . Right sided abdominal pain [R10.9] 01/29/2014  . Allergic rhinoconjunctivitis of both eyes [J30.9, H10.13] 01/29/2014  . Vitamin D deficiency [E55.9] 01/29/2014  . SOB (shortness of breath) [R06.02] 01/29/2014  . Tingling [R20.2] 01/29/2014  . Spanish speaking patient [R47.89] 01/29/2014  . Osteoarthritis of left hip [M16.12] 01/29/2014  . DJD (degenerative joint disease), lumbar [M47.816] 01/29/2014  . Umbilical hernia [Q46.9] 01/29/2014  . Hypertension [I10]   . GERD (gastroesophageal reflux disease) [K21.9]     Total Time spent with patient: 1  hour  Subjective:   Barbara Beck is a 81 y.o. female patient admitted with  Increased sadness, desire to die naturally and not eating  HPI:  8 years Spanish woman was evaluated for failure to thrive and wanting to die.  Patient also has a diagnosis of Depression and is taking antidepressants.  Patient sees Dr Sabra Heck our Psychiatrist.  Patient was interviewed via a [place] interpreter.  Patient have been in and out of the hospital for medical and Psychiatric issues.  She was discharged from the medical floor 3 days ago and was brought back in yesterday by her daughter for increased depression.  Patient reported that she blames herself for changes made in her daughter's home.  Her daughter changed and moved furniture in the house to accommodate her.  Patient reports that she feels sad because she is a burden to her daughter.  She also stated that she misses's her home country[place]  She also stated that going back is not an option because she does not want to move to a Nursing home.  She want to die because she is now living off her daughter's care but has promised she will not do anything to kill or hurt herself.  She states she will prefer her "God" take her.  Patient reports poor appetite because food does not test well and that her gum problems prevents her from enjoying her food.  Patient denies any physical, emotional or sexual abuse at home.  Patient denies SI/HI/AVH.   Patient will continue taking her antidepressant and will need counseling. She is referred to [facility].  Past Psychiatric History: Depression, R/O Dementia  Risk to Self: Suicidal Ideation:  (Patient does not confirm nor deny) Suicidal Intent:  (n/a) Is patient at risk  for suicide?:  (n/a) Access to Means:  (unk) What has been your use of drugs/alcohol within the last 12 months?:  (unk) How many times?:  (unk) Other Self Harm Risks:  (unk) Triggers for Past Attempts:  (unk) Intentional Self  Injurious Behavior: None (unk) Risk to Others: Homicidal Ideation:  (patient does not confirm nor deny) Thoughts of Harm to Others: No Current Homicidal Intent: No Current Homicidal Plan: No Access to Homicidal Means: No Identified Victim:  (n/a) History of harm to others?: No Assessment of Violence: None Noted Violent Behavior Description:  (patient is not cooperative with answering questions ) Does patient have access to weapons?: No Criminal Charges Pending?: No Does patient have a court date: No Prior Inpatient Therapy: Prior Inpatient Therapy: No Prior Therapy Dates: N/A Prior Therapy Facilty/Provider(s): N/A Reason for Treatment: N/A Prior Outpatient Therapy: Prior Outpatient Therapy: No Prior Therapy Dates: N/A Prior Therapy Facilty/Provider(s): N/A Reason for Treatment: N/A Does patient have an ACCT team?: No Does patient have Intensive In-House Services?  : No Does patient have [facility] services? : No Does patient have P4CC services?: No  Past Medical History:  Past Medical History  Diagnosis Date  . Hypertension ?   Marland Kitchen GERD (gastroesophageal reflux disease) ?   Marland Kitchen Suicidal ideation   . Anxiety   . Depression     Past Surgical History  Procedure Laterality Date  . Eye surgery Left     Cataract Surgery    Family History:  Family History  Problem Relation Age of Onset  . Cancer Daughter     breast with bone mets   . Hypertension Father    Family Psychiatric  History: Unknown Social History:  History  Alcohol Use No    Comment: denied     History  Drug Use No    Comment: denied all drugs, alcohol, and tobacco    Social History   Social History  . Marital Status: Widowed    Spouse Name: N/A  . Number of Children: 5  . Years of Education: 0   Occupational History  . Retired     Social History Main Topics  . Smoking status: Former Smoker -- 0.00 packs/day for 13 years    Types: Cigarettes    Quit date: 01/19/1984  . Smokeless tobacco: Never Used   . Alcohol Use: No     Comment: denied  . Drug Use: No     Comment: denied all drugs, alcohol, and tobacco  . Sexual Activity: No   Other Topics Concern  . None   Social History Narrative   From [place]   Moved to [place] in [place], back to [place], back to [place] 01/18/2014.    Did not attend school.    Has 4 living children. Had 5 total, one died at 24 months old.    Lives with son and daughter-in-law in [place].   Has a daughter, Conception Oms, who is in the [place].    Additional Social History:    Pain Medications: SEE MAR Prescriptions: SEE MAR Over the Counter: SEE MAR History of alcohol / drug use?: No history of alcohol / drug abuse                     Allergies:   Allergies  Allergen Reactions  . Aspirin Hives and Nausea Only    NOTHING THAT CONTAINS ASPIRIN    Labs:  Results for orders placed or performed during the hospital encounter of 10/22/14 (from the past 48  hour(s))  CBC with Differential/Platelet     Status: None   Collection Time: 10/22/14  4:29 PM  Result Value Ref Range   WBC 9.5 4.0 - 10.5 K/uL   RBC 4.81 3.87 - 5.11 MIL/uL   Hemoglobin 14.2 12.0 - 15.0 g/dL   HCT 42.4 36.0 - 46.0 %   MCV 88.1 78.0 - 100.0 fL   MCH 29.5 26.0 - 34.0 pg   MCHC 33.5 30.0 - 36.0 g/dL   RDW 13.4 11.5 - 15.5 %   Platelets 297 150 - 400 K/uL   Neutrophils Relative % 60 %   Neutro Abs 5.8 1.7 - 7.7 K/uL   Lymphocytes Relative 33 %   Lymphs Abs 3.1 0.7 - 4.0 K/uL   Monocytes Relative 6 %   Monocytes Absolute 0.5 0.1 - 1.0 K/uL   Eosinophils Relative 1 %   Eosinophils Absolute 0.1 0.0 - 0.7 K/uL   Basophils Relative 0 %   Basophils Absolute 0.0 0.0 - 0.1 K/uL  Comprehensive metabolic panel     Status: Abnormal   Collection Time: 10/22/14  4:29 PM  Result Value Ref Range   Sodium 138 135 - 145 mmol/L   Potassium 3.7 3.5 - 5.1 mmol/L   Chloride 104 101 - 111 mmol/L   CO2 28 22 - 32 mmol/L   Glucose, Bld 79 65 - 99 mg/dL   BUN 13 6 - 20 mg/dL   Creatinine, Ser 0.58  0.44 - 1.00 mg/dL   Calcium 8.6 (L) 8.9 - 10.3 mg/dL   Total Protein 7.1 6.5 - 8.1 g/dL   Albumin 3.5 3.5 - 5.0 g/dL   AST 23 15 - 41 U/L   ALT 38 14 - 54 U/L   Alkaline Phosphatase 88 38 - 126 U/L   Total Bilirubin 0.4 0.3 - 1.2 mg/dL   GFR calc non Af Amer >60 >60 mL/min   GFR calc Af Amer >60 >60 mL/min    Comment: (NOTE) The eGFR has been calculated using the CKD EPI equation. This calculation has not been validated in all clinical situations. eGFR's persistently <60 mL/min signify possible Chronic Kidney Disease.    Anion gap 6 5 - 15  Ethanol     Status: None   Collection Time: 10/22/14  4:29 PM  Result Value Ref Range   Alcohol, Ethyl (B) <5 <5 mg/dL    Comment:        LOWEST DETECTABLE LIMIT FOR SERUM ALCOHOL IS 5 mg/dL FOR MEDICAL PURPOSES ONLY     Current Facility-Administered Medications  Medication Dose Route Frequency Provider Last Rate Last Dose  . acetaminophen (TYLENOL) tablet 650 mg  650 mg Oral TID PRN Jola Schmidt, MD      . amLODipine (NORVASC) tablet 10 mg  10 mg Oral Daily Jola Schmidt, MD   10 mg at 10/23/14 1018  . cyanocobalamin tablet 500 mcg  500 mcg Oral Daily Jola Schmidt, MD   500 mcg at 10/23/14 1018  . donepezil (ARICEPT) tablet 5 mg  5 mg Oral QHS Jola Schmidt, MD   5 mg at 10/22/14 2000  . famotidine (PEPCID) tablet 20 mg  20 mg Oral BID Jola Schmidt, MD   20 mg at 10/23/14 1018  . hydrochlorothiazide (HYDRODIURIL) tablet 25 mg  25 mg Oral Daily Jola Schmidt, MD   25 mg at 10/23/14 1020  . lisinopril (PRINIVIL,ZESTRIL) tablet 20 mg  20 mg Oral Daily Jola Schmidt, MD   20 mg at 10/23/14 1018  . memantine (  NAMENDA) tablet 5 mg  5 mg Oral BID Jola Schmidt, MD   5 mg at 10/23/14 1018  . sertraline (ZOLOFT) tablet 25 mg  25 mg Oral Daily Jola Schmidt, MD   25 mg at 10/23/14 1018   Current Outpatient Prescriptions  Medication Sig Dispense Refill  . amLODipine (NORVASC) 10 MG tablet Take 1 tablet (10 mg total) by mouth daily. 90 tablet 3  .  Calcium Carb-Cholecalciferol (CALCIUM-VITAMIN D) 600-400 MG-UNIT TABS Take 1 tablet by mouth daily.     . cyanocobalamin 500 MCG tablet Take 1 tablet (500 mcg total) by mouth daily. 90 tablet 3  . donepezil (ARICEPT) 5 MG tablet Take 1 tablet (5 mg total) by mouth at bedtime. 30 tablet 3  . feeding supplement, ENSURE ENLIVE, (ENSURE ENLIVE) LIQD Take 237 mLs by mouth 3 (three) times daily between meals. 237 mL 12  . fluticasone (FLONASE) 50 MCG/ACT nasal spray Place 2 sprays into both nostrils daily. 16 g 6  . lisinopril-hydrochlorothiazide (PRINZIDE,ZESTORETIC) 20-25 MG tablet Take 1 tablet by mouth daily.    . memantine (NAMENDA) 5 MG tablet Take 1 tablet (5 mg total) by mouth 2 (two) times daily. 60 tablet 3  . nystatin (MYCOSTATIN) 100000 UNIT/ML suspension Take 5 mLs (500,000 Units total) by mouth 4 (four) times daily. 60 mL 0  . ranitidine (ZANTAC) 150 MG tablet TAKE 1 TABLET BY MOUTH 2 TIMES DAILY (Patient taking differently: TAKE 1 TABLET BY MOUTH TWICE DAILY.) 60 tablet 2  . sertraline (ZOLOFT) 25 MG tablet Take 1 tablet (25 mg total) by mouth daily. 30 tablet 3    Musculoskeletal: Strength & Muscle Tone: SEEN IN BED DURING ASSESSMENT Gait & Station: Seen in bed Patient leans: seen in bed  Psychiatric Specialty Exam: Review of Systems  Unable to perform ROS: mental acuity    Blood pressure 122/44, pulse 61, temperature 98.4 F (36.9 C), temperature source Oral, resp. rate 16, weight 53.978 kg (119 lb), SpO2 97 %.Body mass index is 23.24 kg/(m^2).  General Appearance: Casual  Eye Contact::  Fair  Speech:  Clear and Coherent, Slow and via Spanish interpreter  Volume:  Decreased  Mood:  Depressed  Affect:  Congruent, Depressed and Flat  Thought Process:  Coherent  Orientation:  Full (Time, Place, and Person)  Thought Content:  WDL  Suicidal Thoughts:  No  Homicidal Thoughts:  No  Memory:  Immediate;   Fair Recent;   Fair Remote;   Fair  Judgement:  Fair  Insight:  Good   Psychomotor Activity:  Psychomotor Retardation  Concentration:  Good  Recall:  Poor  Fund of Knowledge:Good  Language: via [place] interpreter  Akathisia:  NA  Handed:  Right  AIMS (if indicated):     Assets:  Desire for Improvement  ADL's:  Intact  Cognition: WNL  Sleep:       Disposition: Discharge home, referred to [facility]   PMHNP-BC 10/23/2014 12:24 PM

## 2014-10-23 NOTE — ED Notes (Signed)
Barbara Barge, RN with [facility] will be accepting patient when the medical provider reviews the EKG, chest x-ray, and urine results. He reports [facility] will call back tomorrow about placement.

## 2014-10-24 ENCOUNTER — Encounter: Payer: Self-pay | Admitting: Family Medicine

## 2014-10-24 NOTE — Telephone Encounter (Signed)
Patient was called to advise that paperwork was ready to be picked up.

## 2014-10-27 ENCOUNTER — Other Ambulatory Visit: Payer: Self-pay

## 2014-10-27 ENCOUNTER — Emergency Department (HOSPITAL_COMMUNITY)
Admission: EM | Admit: 2014-10-27 | Discharge: 2014-10-28 | Disposition: A | Discharge status: Other Acute Inpatient Hospital | Payer: Federal, State, Local not specified - Other | Attending: Emergency Medicine | Admitting: Emergency Medicine

## 2014-10-27 ENCOUNTER — Encounter (HOSPITAL_COMMUNITY): Payer: Self-pay | Admitting: Emergency Medicine

## 2014-10-27 ENCOUNTER — Emergency Department (HOSPITAL_COMMUNITY): Payer: Federal, State, Local not specified - Other

## 2014-10-27 DIAGNOSIS — R55 Syncope and collapse: Secondary | ICD-10-CM | POA: Insufficient documentation

## 2014-10-27 DIAGNOSIS — F332 Major depressive disorder, recurrent severe without psychotic features: Secondary | ICD-10-CM | POA: Insufficient documentation

## 2014-10-27 DIAGNOSIS — Z87891 Personal history of nicotine dependence: Secondary | ICD-10-CM | POA: Insufficient documentation

## 2014-10-27 DIAGNOSIS — K219 Gastro-esophageal reflux disease without esophagitis: Secondary | ICD-10-CM | POA: Insufficient documentation

## 2014-10-27 DIAGNOSIS — I10 Essential (primary) hypertension: Secondary | ICD-10-CM | POA: Insufficient documentation

## 2014-10-27 DIAGNOSIS — Z7951 Long term (current) use of inhaled steroids: Secondary | ICD-10-CM | POA: Insufficient documentation

## 2014-10-27 DIAGNOSIS — Z79899 Other long term (current) drug therapy: Secondary | ICD-10-CM | POA: Insufficient documentation

## 2014-10-27 DIAGNOSIS — F0390 Unspecified dementia without behavioral disturbance: Secondary | ICD-10-CM | POA: Insufficient documentation

## 2014-10-27 DIAGNOSIS — F419 Anxiety disorder, unspecified: Secondary | ICD-10-CM | POA: Insufficient documentation

## 2014-10-27 HISTORY — DX: Unspecified dementia, unspecified severity, without behavioral disturbance, psychotic disturbance, mood disturbance, and anxiety: F03.90

## 2014-10-27 LAB — COMPREHENSIVE METABOLIC PANEL
ALK PHOS: 84 U/L (ref 38–126)
ALT: 21 U/L (ref 14–54)
ANION GAP: 10 (ref 5–15)
AST: 18 U/L (ref 15–41)
Albumin: 3.7 g/dL (ref 3.5–5.0)
BILIRUBIN TOTAL: 1.2 mg/dL (ref 0.3–1.2)
BUN: 14 mg/dL (ref 6–20)
CALCIUM: 8.9 mg/dL (ref 8.9–10.3)
CO2: 30 mmol/L (ref 22–32)
CREATININE: 0.71 mg/dL (ref 0.44–1.00)
Chloride: 95 mmol/L — ABNORMAL LOW (ref 101–111)
Glucose, Bld: 81 mg/dL (ref 65–99)
Potassium: 3.1 mmol/L — ABNORMAL LOW (ref 3.5–5.1)
SODIUM: 135 mmol/L (ref 135–145)
TOTAL PROTEIN: 7.3 g/dL (ref 6.5–8.1)

## 2014-10-27 LAB — CBC WITH DIFFERENTIAL/PLATELET
BASOS PCT: 0 %
Basophils Absolute: 0 10*3/uL (ref 0.0–0.1)
EOS ABS: 0 10*3/uL (ref 0.0–0.7)
EOS PCT: 0 %
HCT: 42.1 % (ref 36.0–46.0)
HEMOGLOBIN: 14.3 g/dL (ref 12.0–15.0)
LYMPHS ABS: 2.8 10*3/uL (ref 0.7–4.0)
Lymphocytes Relative: 27 %
MCH: 29.5 pg (ref 26.0–34.0)
MCHC: 34 g/dL (ref 30.0–36.0)
MCV: 87 fL (ref 78.0–100.0)
Monocytes Absolute: 0.8 10*3/uL (ref 0.1–1.0)
Monocytes Relative: 7 %
NEUTROS PCT: 66 %
Neutro Abs: 6.6 10*3/uL (ref 1.7–7.7)
PLATELETS: 366 10*3/uL (ref 150–400)
RBC: 4.84 MIL/uL (ref 3.87–5.11)
RDW: 12.8 % (ref 11.5–15.5)
WBC: 10.2 10*3/uL (ref 4.0–10.5)

## 2014-10-27 LAB — ACETAMINOPHEN LEVEL: Acetaminophen (Tylenol), Serum: <10 ug/mL — ABNORMAL LOW (ref 10–30)

## 2014-10-27 LAB — ETHANOL

## 2014-10-27 LAB — CBG MONITORING, ED: Glucose-Capillary: 93 mg/dL (ref 65–99)

## 2014-10-27 LAB — RAPID URINE DRUG SCREEN, HOSP PERFORMED
AMPHETAMINES: NOT DETECTED
Barbiturates: NOT DETECTED
Benzodiazepines: NOT DETECTED
COCAINE: NOT DETECTED
OPIATES: NOT DETECTED
TETRAHYDROCANNABINOL: NOT DETECTED

## 2014-10-27 LAB — SALICYLATE LEVEL

## 2014-10-27 MED ORDER — HALOPERIDOL LACTATE 5 MG/ML IJ SOLN
5.0000 mg | Freq: Once | INTRAMUSCULAR | Status: DC
  Filled 2014-10-27: qty 1

## 2014-10-27 MED ORDER — FAMOTIDINE 20 MG PO TABS
20.0000 mg | ORAL_TABLET | Freq: Every day | ORAL | Status: DC
  Administered 2014-10-27 – 2014-10-28 (×2): 20 mg via ORAL
  Filled 2014-10-27 (×2): qty 1

## 2014-10-27 MED ORDER — AMLODIPINE BESYLATE 10 MG PO TABS
10.0000 mg | ORAL_TABLET | Freq: Every day | ORAL | Status: DC
  Administered 2014-10-28: 10 mg via ORAL
  Filled 2014-10-27 (×2): qty 1

## 2014-10-27 MED ORDER — HALOPERIDOL LACTATE 5 MG/ML IJ SOLN: 5.0000 mg | Freq: Once | INTRAMUSCULAR | Status: DC

## 2014-10-27 MED ORDER — CYANOCOBALAMIN 500 MCG PO TABS
500.0000 ug | ORAL_TABLET | Freq: Every day | ORAL | Status: DC
  Administered 2014-10-27 – 2014-10-28 (×2): 500 ug via ORAL
  Filled 2014-10-27 (×2): qty 1

## 2014-10-27 MED ORDER — CALCIUM CARBONATE-VITAMIN D 500-200 MG-UNIT PO TABS
1.0000 | ORAL_TABLET | Freq: Every day | ORAL | Status: DC
  Administered 2014-10-27 – 2014-10-28 (×2): 1 via ORAL
  Filled 2014-10-27 (×2): qty 1

## 2014-10-27 MED ORDER — FLUTICASONE PROPIONATE 50 MCG/ACT NA SUSP
2.0000 | Freq: Every day | NASAL | Status: DC
  Administered 2014-10-28: 2 via NASAL
  Filled 2014-10-27: qty 16

## 2014-10-27 MED ORDER — LISINOPRIL-HYDROCHLOROTHIAZIDE 20-25 MG PO TABS: 1.0000 | ORAL_TABLET | Freq: Every day | ORAL | Status: DC

## 2014-10-27 MED ORDER — LISINOPRIL 20 MG PO TABS
20.0000 mg | ORAL_TABLET | Freq: Every day | ORAL | Status: DC
  Administered 2014-10-28: 20 mg via ORAL
  Filled 2014-10-27 (×2): qty 1

## 2014-10-27 MED ORDER — HALOPERIDOL LACTATE 5 MG/ML IJ SOLN
5.0000 mg | Freq: Once | INTRAMUSCULAR | Status: AC
  Administered 2014-10-27: 5 mg via INTRAMUSCULAR

## 2014-10-27 MED ORDER — HYDROCHLOROTHIAZIDE 25 MG PO TABS
25.0000 mg | ORAL_TABLET | Freq: Every day | ORAL | Status: DC
  Administered 2014-10-28: 25 mg via ORAL
  Filled 2014-10-27 (×2): qty 1

## 2014-10-27 MED ORDER — DONEPEZIL HCL 5 MG PO TABS
5.0000 mg | ORAL_TABLET | Freq: Every day | ORAL | Status: DC
  Administered 2014-10-27: 5 mg via ORAL
  Filled 2014-10-27 (×2): qty 1

## 2014-10-27 MED ORDER — MEMANTINE HCL 5 MG PO TABS
5.0000 mg | ORAL_TABLET | Freq: Two times a day (BID) | ORAL | Status: DC
  Administered 2014-10-27 – 2014-10-28 (×2): 5 mg via ORAL
  Filled 2014-10-27 (×3): qty 1

## 2014-10-27 MED ORDER — ENSURE ENLIVE PO LIQD
237.0000 mL | Freq: Three times a day (TID) | ORAL | Status: DC
  Administered 2014-10-27 – 2014-10-28 (×4): 237 mL via ORAL
  Filled 2014-10-27 (×5): qty 237

## 2014-10-27 MED ORDER — SERTRALINE HCL 50 MG PO TABS
25.0000 mg | ORAL_TABLET | Freq: Every day | ORAL | Status: DC
  Administered 2014-10-27 – 2014-10-28 (×2): 25 mg via ORAL
  Filled 2014-10-27 (×2): qty 1

## 2014-10-27 MED ORDER — LORAZEPAM 2 MG/ML IJ SOLN
1.0000 mg | Freq: Once | INTRAMUSCULAR | Status: AC
  Administered 2014-10-27: 1 mg via INTRAMUSCULAR
  Filled 2014-10-27: qty 1

## 2014-10-27 NOTE — ED Notes (Signed)
BP medications held due to BP108/46 HR64

## 2014-10-27 NOTE — ED Notes (Signed)
Pt's daughter in law states that pt was at church this am when she had syncopal episode.  Pt did have breakfast this am per DIL, whim pt resides with.  Pt has recently been treated here for UTI and SI.

## 2014-10-27 NOTE — ED Provider Notes (Signed)
CSN: [id number]     Arrival date & time 10/27/14  1326 History   First MD Initiated Contact with Patient 10/27/14 1336     Chief Complaint  Patient presents with  . Loss of Consciousness  . Suicidal     (Consider location/radiation/quality/duration/timing/severity/associated sxs/prior Treatment) Patient is a 81 y.o. female presenting with syncope. The history is provided by the patient.  Loss of Consciousness Episode history:  Single Most recent episode:  Today Duration:  5 minutes Timing:  Constant Progression:  Worsening Chronicity:  New Witnessed: yes   Relieved by:  Nothing Worsened by:  Nothing tried Ineffective treatments:  None tried Associated symptoms: no chest pain, no dizziness, no fever, no headaches, no nausea, no palpitations, no shortness of breath and no vomiting     81 yo F with a chief complaint suicidal ideation. This been an issue for the past month. Family states that since her birthday she has been acting this way. She has been admitted 2 other separate times for this issue. Has had good days and bad days since then. Is on Zoloft. Has been compliant with her medications. Family has taken all of her medications so she is unable to overdose. Today while at church patient was falling over while sitting. When they got her to get up she felt to the ground. Was unresponsive for them for about 5 minutes. When EMS arrived they pull open her eyelids and she spontaneously started moving again. Patient denies any headaches chest pain abdominal pain shortness breath fevers chills nausea vomiting diarrhea.  Past Medical History  Diagnosis Date  . Hypertension ?   Marland Kitchen GERD (gastroesophageal reflux disease) ?   Marland Kitchen Suicidal ideation   . Anxiety   . Depression   . Dementia    Past Surgical History  Procedure Laterality Date  . Eye surgery Left     Cataract Surgery    Family History  Problem Relation Age of Onset  . Cancer Daughter     breast with bone mets   .  Hypertension Father    Social History  Substance Use Topics  . Smoking status: Former Smoker -- 0.00 packs/day for 13 years    Types: Cigarettes    Quit date: 01/19/1984  . Smokeless tobacco: Never Used  . Alcohol Use: No     Comment: denied   OB History    No data available     Review of Systems  Constitutional: Negative for fever and chills.  HENT: Negative for congestion and rhinorrhea.   Eyes: Negative for redness and visual disturbance.  Respiratory: Negative for shortness of breath and wheezing.   Cardiovascular: Positive for syncope. Negative for chest pain and palpitations.  Gastrointestinal: Negative for nausea and vomiting.  Genitourinary: Negative for dysuria and urgency.  Musculoskeletal: Negative for myalgias and arthralgias.  Skin: Negative for pallor and wound.  Neurological: Negative for dizziness and headaches.  Psychiatric/Behavioral: Positive for suicidal ideas and dysphoric mood.      Allergies  Aspirin  Home Medications   Prior to Admission medications   Medication Sig Start Date End Date Taking? Authorizing Provider  amLODipine (NORVASC) 10 MG tablet Take 1 tablet (10 mg total) by mouth daily. 08/08/14  Yes Josalyn Funches, MD  Calcium Carb-Cholecalciferol (CALCIUM-VITAMIN D) 600-400 MG-UNIT TABS Take 1 tablet by mouth daily.    Yes Historical Provider, MD  cyanocobalamin 500 MCG tablet Take 1 tablet (500 mcg total) by mouth daily. 02/28/14  Yes Dessa Phi, MD  donepezil (ARICEPT) 5  MG tablet Take 1 tablet (5 mg total) by mouth at bedtime. 10/21/14  Yes Marinda Elk, MD  feeding supplement, ENSURE ENLIVE, (ENSURE ENLIVE) LIQD Take 237 mLs by mouth 3 (three) times daily between meals. 10/16/14  Yes Belkys A Regalado, MD  fluticasone (FLONASE) 50 MCG/ACT nasal spray Place 2 sprays into both nostrils daily. 01/29/14  Yes Josalyn Funches, MD  lisinopril-hydrochlorothiazide (PRINZIDE,ZESTORETIC) 20-25 MG tablet Take 1 tablet by mouth daily.   Yes  Historical Provider, MD  memantine (NAMENDA) 5 MG tablet Take 1 tablet (5 mg total) by mouth 2 (two) times daily. 10/21/14  Yes Marinda Elk, MD  ranitidine (ZANTAC) 150 MG tablet TAKE 1 TABLET BY MOUTH 2 TIMES DAILY Patient taking differently: TAKE 1 TABLET BY MOUTH TWICE DAILY. 08/02/14  Yes Josalyn Funches, MD  sertraline (ZOLOFT) 25 MG tablet Take 1 tablet (25 mg total) by mouth daily. 10/21/14  Yes Marinda Elk, MD  nystatin (MYCOSTATIN) 100000 UNIT/ML suspension Take 5 mLs (500,000 Units total) by mouth 4 (four) times daily. Patient not taking: Reported on 10/27/2014 10/16/14   Belkys A Regalado, MD  sertraline (ZOLOFT) 25 MG tablet Take 1 tablet (25 mg total) by mouth daily. Patient not taking: Reported on 10/27/2014 10/23/14   Earney Navy, NP   BP 118/52 mmHg  Pulse 53  Temp(Src) 97.8 F (36.6 C) (Oral)  Resp 16  SpO2 99% Physical Exam  Constitutional: She is oriented to person, place, and time. She appears well-developed and well-nourished. No distress.  HENT:  Head: Normocephalic and atraumatic.  Eyes: EOM are normal. Pupils are equal, round, and reactive to light.  Neck: Normal range of motion. Neck supple.  Cardiovascular: Normal rate and regular rhythm.  Exam reveals no gallop and no friction rub.   No murmur heard. Pulmonary/Chest: Effort normal. She has no wheezes. She has no rales.  Abdominal: Soft. She exhibits no distension. There is no tenderness.  Musculoskeletal: She exhibits no edema or tenderness.  Neurological: She is alert and oriented to person, place, and time.  Skin: Skin is warm and dry. She is not diaphoretic.  Psychiatric: Her behavior is normal. She exhibits a depressed mood. She expresses suicidal ideation. She expresses no homicidal ideation. She expresses no suicidal plans and no homicidal plans.  Blunted response     ED Course  Procedures (including critical care time) Labs Review Labs Reviewed  COMPREHENSIVE METABOLIC PANEL -  Abnormal; Notable for the following:    Potassium 3.1 (*)    Chloride 95 (*)    All other components within normal limits  ACETAMINOPHEN LEVEL - Abnormal; Notable for the following:    Acetaminophen (Tylenol), Serum <10 (*)    All other components within normal limits  URINALYSIS, ROUTINE W REFLEX MICROSCOPIC (NOT AT [facility]) - Abnormal; Notable for the following:    APPearance TURBID (*)    Glucose, UA 100 (*)    Hgb urine dipstick TRACE (*)    Leukocytes, UA LARGE (*)    All other components within normal limits  URINE MICROSCOPIC-ADD ON - Abnormal; Notable for the following:    Bacteria, UA MANY (*)    All other components within normal limits  ETHANOL  CBC WITH DIFFERENTIAL/PLATELET  URINE RAPID DRUG SCREEN, HOSP PERFORMED  SALICYLATE LEVEL    Imaging Review Dg Chest Port 1 View  10/27/2014   CLINICAL DATA:  Altered mental status.  Syncope today.  EXAM: PORTABLE CHEST 1 VIEW  COMPARISON:  10/22/2014 and 10/19/2014 and 10/06/2014 and  01/29/2014  FINDINGS: Chronic cardiomegaly with calcification of the thoracic aorta. Chronic accentuation of the interstitial markings. Small focal linear area of scarring in the left lung base laterally.  No acute infiltrates or effusions.  No acute osseous abnormality.  IMPRESSION: No acute abnormalities.  Chronic cardiomegaly.   Electronically Signed   By: Francene Boyers M.D.   On: 10/27/2014 16:23   I have personally reviewed and evaluated these images and lab results as part of my medical decision-making.   EKG Interpretation   Date/Time:  Sunday October 27 2014 14:31:02 EDT Ventricular Rate:  56 PR Interval:  194 QRS Duration: 84 QT Interval:  441 QTC Calculation: 426 R Axis:   2 Text Interpretation:  Sinus rhythm Consider left ventricular hypertrophy  Baseline wander in lead(s) V3 V4 Artifact Nonspecific TW abnormality  Confirmed by [facility] MD, ERIN ([id number]) on 10/28/2014 1:53:44 AM      MDM   Final diagnoses:  Major depressive  disorder, recurrent severe without psychotic features (HCC)    81 yo F with a chief complaint of suicidal ideation. Had similar episodes here previously all with negative workups. Negative CT head and TSH previously. No Reason to Repeat Those at This Time. Will have TTS Evaluate.  The patients results and plan were reviewed and discussed.   Any x-rays performed were independently reviewed by myself.   Differential diagnosis were considered with the presenting HPI.  Medications  amLODipine (NORVASC) tablet 10 mg (10 mg Oral Given 10/28/14 1040)  calcium-vitamin D (OSCAL WITH D) 500-200 MG-UNIT per tablet 1 tablet (1 tablet Oral Given 10/28/14 1039)  cyanocobalamin tablet 500 mcg (500 mcg Oral Given 10/28/14 1040)  donepezil (ARICEPT) tablet 5 mg (5 mg Oral Given 10/27/14 2011)  feeding supplement (ENSURE ENLIVE) (ENSURE ENLIVE) liquid 237 mL (237 mLs Oral Given 10/28/14 1039)  fluticasone (FLONASE) 50 MCG/ACT nasal spray 2 spray (2 sprays Each Nare Given 10/28/14 1039)  memantine (NAMENDA) tablet 5 mg (5 mg Oral Given 10/28/14 1040)  famotidine (PEPCID) tablet 20 mg (20 mg Oral Given 10/28/14 1039)  lisinopril (PRINIVIL,ZESTRIL) tablet 20 mg (20 mg Oral Given 10/28/14 1040)    And  hydrochlorothiazide (HYDRODIURIL) tablet 25 mg (25 mg Oral Given 10/28/14 1040)  potassium chloride 20 MEQ/15ML (10%) solution 20 mEq (20 mEq Oral Given 10/28/14 1039)  0.9 %  sodium chloride infusion (not administered)  sertraline (ZOLOFT) tablet 50 mg (not administered)  LORazepam (ATIVAN) injection 1 mg (1 mg Intramuscular Given 10/27/14 1524)  haloperidol lactate (HALDOL) injection 5 mg (5 mg Intramuscular Given 10/27/14 1533)  sodium chloride 0.9 % bolus 1,000 mL (0 mLs Intravenous Stopped 10/28/14 1420)    Filed Vitals:   10/27/14 2259 10/28/14 0628 10/28/14 1040 10/28/14 1408  BP: 124/54 136/53 137/65 118/52  Pulse: 61 60  53  Temp: 98.2 F (36.8 C) 97.4 F (36.3 C)  97.8 F (36.6 C)  TempSrc: Oral  Oral  Oral  Resp: 20 18  16   SpO2: 96% 98%  99%    Final diagnoses:  Major depressive disorder, recurrent severe without psychotic features (HCC)    Admission/ observation were discussed with the admitting physician, patient and/or family and they are comfortable with the plan.    Melene Plan, DO 10/28/14 1526

## 2014-10-27 NOTE — ED Notes (Addendum)
MD at bedside with Spanish interpreter on phone.

## 2014-10-27 NOTE — ED Notes (Signed)
Lab delay - pt combative/not cooperative.

## 2014-10-27 NOTE — Progress Notes (Addendum)
Disposition CSW completed patient referrals for the following Geri-Psych facilities:  [facility]. [facility]  CSW will follow patient for placement needs.  Seward Speck [facility] Disposition CSW [phone]

## 2014-10-27 NOTE — ED Notes (Signed)
CN has spoken with [facility] twice regarding the need for a sitter.
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
## 2014-10-27 NOTE — ED Notes (Signed)
Pt found trying to get her head through the side bars of bed attempting to asphyxiate herself.   Family member bedside attempting to restrain Pt.  Seizure precaution pads installed on bed.  Charge RN & Provider notified.

## 2014-10-27 NOTE — ED Notes (Signed)
Patient has become actively suicidal. Patient trying to hand herself with the bed rails on the stretcher. RN, Charge and DR made aware of situation. Seizure pads placed on the rails to prevent patient from putting head in the rails. Family member at beside assisting staff with patient. Patient clothing and Ralph Dowdy has been removed. Family has belongings. Patient has been wanded by security.

## 2014-10-27 NOTE — ED Notes (Signed)
Patient did allow this tech to perform EKG. Patient still guarded and tearful.

## 2014-10-28 ENCOUNTER — Inpatient Hospital Stay: Payer: Self-pay | Admitting: Family Medicine

## 2014-10-28 DIAGNOSIS — R45851 Suicidal ideations: Secondary | ICD-10-CM | POA: Diagnosis not present

## 2014-10-28 DIAGNOSIS — F332 Major depressive disorder, recurrent severe without psychotic features: Secondary | ICD-10-CM | POA: Diagnosis not present

## 2014-10-28 LAB — URINE MICROSCOPIC-ADD ON

## 2014-10-28 LAB — URINALYSIS, ROUTINE W REFLEX MICROSCOPIC
BILIRUBIN URINE: NEGATIVE
GLUCOSE, UA: 100 mg/dL — AB
KETONES UR: NEGATIVE mg/dL
Nitrite: NEGATIVE
PROTEIN: NEGATIVE mg/dL
Specific Gravity, Urine: 1.008 (ref 1.005–1.030)
UROBILINOGEN UA: 1 mg/dL (ref 0.0–1.0)
pH: 5.5 (ref 5.0–8.0)

## 2014-10-28 MED ORDER — SODIUM CHLORIDE 0.9 % IV BOLUS (SEPSIS)
1000.0000 mL | Freq: Once | INTRAVENOUS | Status: AC
  Administered 2014-10-28: 1000 mL via INTRAVENOUS

## 2014-10-28 MED ORDER — POTASSIUM CHLORIDE 20 MEQ/15ML (10%) PO SOLN
20.0000 meq | Freq: Every day | ORAL | Status: DC
  Administered 2014-10-28: 20 meq via ORAL
  Filled 2014-10-28: qty 15

## 2014-10-28 MED ORDER — SERTRALINE HCL 50 MG PO TABS: 50.0000 mg | ORAL_TABLET | Freq: Every day | ORAL | Status: DC

## 2014-10-28 MED ORDER — SODIUM CHLORIDE 0.9 % IV SOLN: INTRAVENOUS | Status: DC

## 2014-10-28 NOTE — BH Assessment (Signed)
Faxed pt's labs to [facility] for review for inpatient treatment.

## 2014-10-28 NOTE — ED Notes (Signed)
Report called to Nicholos Johns at [facility].    Called placed to [facility] for patient transport.

## 2014-10-28 NOTE — ED Provider Notes (Signed)
12:30 PM I was asked to evaluate patient by psychiatric team. She has not eaten or drunk in the past refers except for once this morning. On exam patient is sleepy easily arousable and verbal signals. Mucous members dry. Neck supple lungs clear auscultation heart regular rate and rhythm abdomen nondistended nontender; edema. Suggest 1 L intravenous bolus. Maintenance IV fluids on this patient is approximately 80 mL per hour. She should be encouraged to drink. Suggest recheck electrolytes, BUN/creatinine. She will be catheterized for urine. Laboratory results to be checked by psychiatric nurse practitioner  Doug Sou, MD 10/28/14 1240

## 2014-10-28 NOTE — BH Assessment (Signed)
[facility] Assessment Progress Note  Per Thedore Mins, MD, this pt requires psychiatric hospitalization.  Nicholos Johns from [facility] reports that pt has been accepted to their facility by Dr Seth Bake contingent upon pt being placed under IVC.  Nanine Means, NP agrees to this, and EDP Doug Sou, MD has initiated IVC.  Petition has been faxed to [employer], who confirms receipt.  Petition along with First Examination has been faxed to [facility].  Pt's nurse has been notified.  She agrees to call report to [phone].  Pt is to be transported via [facility].  Doylene Canning, MA Triage Specialist [phone]

## 2014-10-28 NOTE — ED Notes (Signed)
Patient ate approx 50% of dinner and consumed fluid.  Documented in I/O.

## 2014-10-28 NOTE — ED Notes (Signed)
Interpreter, Raynelle Fanning, notified of patient's order for IV and in/out urinary cath.  She will come to TCU to inform patient of pending orders.

## 2014-10-28 NOTE — ED Notes (Signed)
[employer] teacher at bedside to transport patient to [facility].

## 2014-10-28 NOTE — Consult Note (Signed)
[facility] Consult   Reason for Consult:  Suicidal ideations and attempt Referring Physician:  EDP Patient Identification: Barbara Beck MRN:  [id number] Principal Diagnosis: <principal problem not specified> Diagnosis:   Patient Active Problem List   Diagnosis Date Noted  . Major depressive disorder, recurrent, severe without psychotic features ([place]) [F33.2]     Priority: High  . Major depressive disorder, recurrent severe without psychotic features ([place]) [F33.2] 10/23/2014  . Hypoglycemia [E16.2] 10/19/2014  . Moderate malnutrition ([place]) [E44.0]   . MDD (major depressive disorder), recurrent severe, without psychosis ([place]) [F33.2] 10/11/2014  . Dehydration [E86.0]   . Thrush [B37.0]   . Syncope and collapse [R55] 10/09/2014  . Syncope [R55] 10/09/2014  . Metabolic encephalopathy [O13.08] 10/09/2014  . UTI (urinary tract infection) [N39.0] 10/09/2014  . Severe recurrent major depression without psychotic features ([place]) [F33.2] 10/08/2014  . Depression [F32.9] 10/07/2014  . Poor dentition [K08.9] 08/01/2014  . Decreased visual acuity [H54.7] 08/01/2014  . Sinus bradycardia [R00.1] 04/12/2014  . Chronic diarrhea [K52.9] 03/19/2014  . Bradycardia [R00.1] 02/19/2014  . Cataract [H26.9] 02/19/2014  . Bartholin gland cyst [N75.0] 02/19/2014  . Right sided abdominal pain [R10.9] 01/29/2014  . Allergic rhinoconjunctivitis of both eyes [J30.9, H10.13] 01/29/2014  . Vitamin D deficiency [E55.9] 01/29/2014  . SOB (shortness of breath) [R06.02] 01/29/2014  . Tingling [R20.2] 01/29/2014  . Spanish speaking patient [R47.89] 01/29/2014  . Osteoarthritis of left hip [M16.12] 01/29/2014  . DJD (degenerative joint disease), lumbar [M47.816] 01/29/2014  . Umbilical hernia [M57.8] 01/29/2014  . Hypertension [I10]   . GERD (gastroesophageal reflux disease) [K21.9]     Total Time spent with patient: 45 minutes  Subjective:   Barbara Beck is a 81 y.o. female patient  admitted with suicide attempt.  HPI:   81 y.o. female. Patient was brought into the ED after fainting while at church with her family then expressing some suicidal ideations. While in the emergency department bed the patient attempted to use equipment to choke self but when stopped said, "I want to end it". This Probation officer started the interpreter line to complete an assessment but the patient would only say, " I want to end it" multiple times.   This Probation officer spoke with the patient's daughter to collect collateral information. She reports since the patient's birthday on [birth year] there has been consistent symptom of lethargia, tearfulness, suicidal ideations, failure to thrive, isolation, not eating, no fluid intake, not taking medications, and increased sleeping. Also tried to kill herself in the ED with the television cord.    On assessment:  Patient asked questions via interpreter but forwarded little information except she was extremely depressed and suicidal, no desire to live.  Past Psychiatric History: Depression  Risk to Self: Suicidal Ideation: Yes-Currently Present Suicidal Intent: Yes-Currently Present Is patient at risk for suicide?: Yes Suicidal Plan?: Yes-Currently Present Specify Current Suicidal Plan: Pt wrapped cord around neck in hospital bed Access to Means: No What has been your use of drugs/alcohol within the last 12 months?: na How many times?: 1 Other Self Harm Risks: na Triggers for Past Attempts: None known Intentional Self Injurious Behavior: None Risk to Others: Homicidal Ideation: No-Not Currently/Within Last 6 Months Thoughts of Harm to Others: No-Not Currently Present/Within Last 6 Months Current Homicidal Intent: No-Not Currently/Within Last 6 Months Current Homicidal Plan: No-Not Currently/Within Last 6 Months Access to Homicidal Means: No Identified Victim: na History of harm to others?: No Assessment of Violence: None Noted Does patient have access to  weapons?:  No Criminal Charges Pending?: No Does patient have a court date: No Prior Inpatient Therapy: Prior Inpatient Therapy: No Prior Outpatient Therapy: Prior Outpatient Therapy: Yes Prior Therapy Dates: currently Prior Therapy Facilty/Provider(s): [facility] out[facility] Dr. Sabra Heck Reason for Treatment: Major Depression Does patient have an ACCT team?: No Does patient have Intensive In-House Services?  : No Does patient have [facility] services? : No Does patient have P4CC services?: No  Past Medical History:  Past Medical History  Diagnosis Date  . Hypertension ?   Marland Kitchen GERD (gastroesophageal reflux disease) ?   Marland Kitchen Suicidal ideation   . Anxiety   . Depression   . Dementia     Past Surgical History  Procedure Laterality Date  . Eye surgery Left     Cataract Surgery    Family History:  Family History  Problem Relation Age of Onset  . Cancer Daughter     breast with bone mets   . Hypertension Father    Family Psychiatric  History: Depression Social History:  History  Alcohol Use No    Comment: denied     History  Drug Use No    Comment: denied all drugs, alcohol, and tobacco    Social History   Social History  . Marital Status: Widowed    Spouse Name: N/A  . Number of Children: 5  . Years of Education: 0   Occupational History  . Retired     Social History Main Topics  . Smoking status: Former Smoker -- 0.00 packs/day for 13 years    Types: Cigarettes    Quit date: 01/19/1984  . Smokeless tobacco: Never Used  . Alcohol Use: No     Comment: denied  . Drug Use: No     Comment: denied all drugs, alcohol, and tobacco  . Sexual Activity: No   Other Topics Concern  . None   Social History Narrative   From [place]   Moved to [place] in [place], back to [place], back to [place] 01/18/2014.    Did not attend school.    Has 4 living children. Had 5 total, one died at 37 months old.    Lives with son and daughter-in-law in [place].   Has a daughter, Conception Oms, who is in the [place].     Additional Social History:    Pain Medications: See MARs Prescriptions: See MARs Over the Counter: See MARs History of alcohol / drug use?: No history of alcohol / drug abuse                     Allergies:   Allergies  Allergen Reactions  . Aspirin Hives and Nausea Only    NOTHING THAT CONTAINS ASPIRIN    Labs:  Results for orders placed or performed during the hospital encounter of 10/27/14 (from the past 48 hour(s))  Comprehensive metabolic panel     Status: Abnormal   Collection Time: 10/27/14  3:28 PM  Result Value Ref Range   Sodium 135 135 - 145 mmol/L   Potassium 3.1 (L) 3.5 - 5.1 mmol/L   Chloride 95 (L) 101 - 111 mmol/L   CO2 30 22 - 32 mmol/L   Glucose, Bld 81 65 - 99 mg/dL   BUN 14 6 - 20 mg/dL   Creatinine, Ser 0.71 0.44 - 1.00 mg/dL   Calcium 8.9 8.9 - 10.3 mg/dL   Total Protein 7.3 6.5 - 8.1 g/dL   Albumin 3.7 3.5 - 5.0 g/dL   AST 18 15 -  41 U/L   ALT 21 14 - 54 U/L   Alkaline Phosphatase 84 38 - 126 U/L   Total Bilirubin 1.2 0.3 - 1.2 mg/dL   GFR calc non Af Amer >60 >60 mL/min   GFR calc Af Amer >60 >60 mL/min    Comment: (NOTE) The eGFR has been calculated using the CKD EPI equation. This calculation has not been validated in all clinical situations. eGFR's persistently <60 mL/min signify possible Chronic Kidney Disease.    Anion gap 10 5 - 15  Ethanol     Status: None   Collection Time: 10/27/14  3:28 PM  Result Value Ref Range   Alcohol, Ethyl (B) <5 <5 mg/dL    Comment:        LOWEST DETECTABLE LIMIT FOR SERUM ALCOHOL IS 5 mg/dL FOR MEDICAL PURPOSES ONLY   CBC with Diff     Status: None   Collection Time: 10/27/14  3:28 PM  Result Value Ref Range   WBC 10.2 4.0 - 10.5 K/uL   RBC 4.84 3.87 - 5.11 MIL/uL   Hemoglobin 14.3 12.0 - 15.0 g/dL   HCT 42.1 36.0 - 46.0 %   MCV 87.0 78.0 - 100.0 fL   MCH 29.5 26.0 - 34.0 pg   MCHC 34.0 30.0 - 36.0 g/dL   RDW 12.8 11.5 - 15.5 %   Platelets 366 150 - 400 K/uL   Neutrophils Relative %  66 %   Neutro Abs 6.6 1.7 - 7.7 K/uL   Lymphocytes Relative 27 %   Lymphs Abs 2.8 0.7 - 4.0 K/uL   Monocytes Relative 7 %   Monocytes Absolute 0.8 0.1 - 1.0 K/uL   Eosinophils Relative 0 %   Eosinophils Absolute 0.0 0.0 - 0.7 K/uL   Basophils Relative 0 %   Basophils Absolute 0.0 0.0 - 0.1 K/uL  Acetaminophen level     Status: Abnormal   Collection Time: 10/27/14  3:28 PM  Result Value Ref Range   Acetaminophen (Tylenol), Serum <10 (L) 10 - 30 ug/mL    Comment:        THERAPEUTIC CONCENTRATIONS VARY SIGNIFICANTLY. A RANGE OF 10-30 ug/mL MAY BE AN EFFECTIVE CONCENTRATION FOR MANY PATIENTS. HOWEVER, SOME ARE BEST TREATED AT CONCENTRATIONS OUTSIDE THIS RANGE. ACETAMINOPHEN CONCENTRATIONS >150 ug/mL AT 4 HOURS AFTER INGESTION AND >50 ug/mL AT 12 HOURS AFTER INGESTION ARE OFTEN ASSOCIATED WITH TOXIC REACTIONS.   Salicylate level     Status: None   Collection Time: 10/27/14  3:28 PM  Result Value Ref Range   Salicylate Lvl <2.4 2.8 - 30.0 mg/dL  Urine rapid drug screen (hosp performed)not at [facility]     Status: None   Collection Time: 10/27/14  3:30 PM  Result Value Ref Range   Opiates NONE DETECTED NONE DETECTED   Cocaine NONE DETECTED NONE DETECTED   Benzodiazepines NONE DETECTED NONE DETECTED   Amphetamines NONE DETECTED NONE DETECTED   Tetrahydrocannabinol NONE DETECTED NONE DETECTED   Barbiturates NONE DETECTED NONE DETECTED    Comment:        DRUG SCREEN FOR MEDICAL PURPOSES ONLY.  IF CONFIRMATION IS NEEDED FOR ANY PURPOSE, NOTIFY LAB WITHIN 5 DAYS.        LOWEST DETECTABLE LIMITS FOR URINE DRUG SCREEN Drug Class       Cutoff (ng/mL) Amphetamine      1000 Barbiturate      200 Benzodiazepine   268 Tricyclics       341 Opiates  300 Cocaine          300 THC              50     Current Facility-Administered Medications  Medication Dose Route Frequency Provider Last Rate Last Dose  . 0.9 %  sodium chloride infusion   Intravenous Continuous Patrecia Pour, NP      . amLODipine (NORVASC) tablet 10 mg  10 mg Oral Daily Patrecia Pour, NP   10 mg at 10/28/14 1040  . calcium-vitamin D (OSCAL WITH D) 500-200 MG-UNIT per tablet 1 tablet  1 tablet Oral Daily Patrecia Pour, NP   1 tablet at 10/28/14 1039  . cyanocobalamin tablet 500 mcg  500 mcg Oral Daily Patrecia Pour, NP   500 mcg at 10/28/14 1040  . donepezil (ARICEPT) tablet 5 mg  5 mg Oral QHS Patrecia Pour, NP   5 mg at 10/27/14 2011  . famotidine (PEPCID) tablet 20 mg  20 mg Oral Daily Patrecia Pour, NP   20 mg at 10/28/14 1039  . feeding supplement (ENSURE ENLIVE) (ENSURE ENLIVE) liquid 237 mL  237 mL Oral TID BM Patrecia Pour, NP   237 mL at 10/28/14 1039  . fluticasone (FLONASE) 50 MCG/ACT nasal spray 2 spray  2 spray Each Nare Daily Patrecia Pour, NP   2 spray at 10/28/14 1039  . lisinopril (PRINIVIL,ZESTRIL) tablet 20 mg  20 mg Oral Daily Deno Etienne, DO   20 mg at 10/28/14 1040   And  . hydrochlorothiazide (HYDRODIURIL) tablet 25 mg  25 mg Oral Daily Deno Etienne, DO   25 mg at 10/28/14 1040  . memantine (NAMENDA) tablet 5 mg  5 mg Oral BID Patrecia Pour, NP   5 mg at 10/28/14 1040  . potassium chloride 20 MEQ/15ML (10%) solution 20 mEq  20 mEq Oral Daily Patrecia Pour, NP   20 mEq at 10/28/14 1039  . sertraline (ZOLOFT) tablet 25 mg  25 mg Oral Daily Patrecia Pour, NP   25 mg at 10/28/14 1041  . sodium chloride 0.9 % bolus 1,000 mL  1,000 mL Intravenous Once Patrecia Pour, NP       Current Outpatient Prescriptions  Medication Sig Dispense Refill  . amLODipine (NORVASC) 10 MG tablet Take 1 tablet (10 mg total) by mouth daily. 90 tablet 3  . Calcium Carb-Cholecalciferol (CALCIUM-VITAMIN D) 600-400 MG-UNIT TABS Take 1 tablet by mouth daily.     . cyanocobalamin 500 MCG tablet Take 1 tablet (500 mcg total) by mouth daily. 90 tablet 3  . donepezil (ARICEPT) 5 MG tablet Take 1 tablet (5 mg total) by mouth at bedtime. 30 tablet 3  . feeding supplement, ENSURE ENLIVE, (ENSURE ENLIVE)  LIQD Take 237 mLs by mouth 3 (three) times daily between meals. 237 mL 12  . fluticasone (FLONASE) 50 MCG/ACT nasal spray Place 2 sprays into both nostrils daily. 16 g 6  . lisinopril-hydrochlorothiazide (PRINZIDE,ZESTORETIC) 20-25 MG tablet Take 1 tablet by mouth daily.    . memantine (NAMENDA) 5 MG tablet Take 1 tablet (5 mg total) by mouth 2 (two) times daily. 60 tablet 3  . ranitidine (ZANTAC) 150 MG tablet TAKE 1 TABLET BY MOUTH 2 TIMES DAILY (Patient taking differently: TAKE 1 TABLET BY MOUTH TWICE DAILY.) 60 tablet 2  . sertraline (ZOLOFT) 25 MG tablet Take 1 tablet (25 mg total) by mouth daily. 30 tablet 3  . nystatin (MYCOSTATIN) 100000 UNIT/ML suspension Take  5 mLs (500,000 Units total) by mouth 4 (four) times daily. (Patient not taking: Reported on 10/27/2014) 60 mL 0  . sertraline (ZOLOFT) 25 MG tablet Take 1 tablet (25 mg total) by mouth daily. (Patient not taking: Reported on 10/27/2014) 30 tablet 0    Musculoskeletal: Strength & Muscle Tone: decreased Gait & Station: normal Patient leans: N/A  Psychiatric Specialty Exam: Review of Systems  Constitutional: Negative.   HENT: Negative.   Eyes: Negative.   Respiratory: Negative.   Cardiovascular: Negative.   Gastrointestinal: Negative.   Genitourinary: Negative.   Musculoskeletal: Negative.   Skin: Negative.   Neurological: Negative.   Endo/Heme/Allergies: Negative.   Psychiatric/Behavioral: Positive for depression and suicidal ideas.    Blood pressure 137/65, pulse 60, temperature 97.4 F (36.3 C), temperature source Oral, resp. rate 18, SpO2 98 %.There is no weight on file to calculate BMI.  General Appearance: Casual  Eye Contact::  Minimal  Speech:  Slow  Volume:  Decreased  Mood:  Depressed  Affect:  Congruent  Thought Process:  Coherent  Orientation:  Full (Time, Place, and Person)  Thought Content:  Rumination  Suicidal Thoughts:  Yes.  with intent/plan  Homicidal Thoughts:  No  Memory:  Immediate;    Fair Recent;   Fair Remote;   Fair  Judgement:  Impaired  Insight:  Lacking  Psychomotor Activity:  Decreased  Concentration:  Fair  Recall:  [employer] of Knowledge:Fair  Language: Good  Akathisia:  No  Handed:  Right  AIMS (if indicated):     Assets:  Leisure Time Resilience Social Support  ADL's:  Intact  Cognition: WNL  Sleep:      Treatment Plan Summary: Daily contact with patient to assess and evaluate symptoms and progress in treatment, Medication management and Plan :  Major depression, recurrent, severe without psychotic features.  -Crisis stabilization -Medication adjustmentment:  Zoloft increased from 25 mg daily to 50 mg for depression; IV bolus of fluids with maintenance for hydration since patient not drinking or eating. -Individual counseling  Disposition: Recommend psychiatric Inpatient admission when medically cleared.  Waylan Boga, [place] 10/28/2014 12:42 PM Patient seen face-to-face for psychiatric evaluation, chart reviewed and case discussed with the physician extender and developed treatment plan. Reviewed the information documented and agree with the treatment plan. Corena Pilgrim, MD

## 2014-10-28 NOTE — ED Notes (Signed)
Awaiting transport to [facility].

## 2014-10-28 NOTE — ED Provider Notes (Signed)
Patient will be involuntarily committed by me, as she tried to hang herself while in the emergency department yesterday. IVC papers and first exam forms filled out by me  Doug Sou, MD 10/28/14 [phone]

## 2014-10-28 NOTE — ED Notes (Signed)
Left message for staff at [facility] unit to give report.  No answer on number provided.

## 2014-10-29 DIAGNOSIS — E876 Hypokalemia: Secondary | ICD-10-CM | POA: Insufficient documentation

## 2014-11-18 ENCOUNTER — Ambulatory Visit: Payer: Self-pay | Attending: Family Medicine | Admitting: Family Medicine

## 2014-11-18 ENCOUNTER — Encounter: Payer: Self-pay | Admitting: Family Medicine

## 2014-11-18 VITALS — BP 168/74 | HR 64 | Temp 98.0°F | Resp 16 | Ht 59.0 in | Wt 117.0 lb

## 2014-11-18 DIAGNOSIS — F332 Major depressive disorder, recurrent severe without psychotic features: Secondary | ICD-10-CM | POA: Insufficient documentation

## 2014-11-18 DIAGNOSIS — Z0000 Encounter for general adult medical examination without abnormal findings: Secondary | ICD-10-CM | POA: Insufficient documentation

## 2014-11-18 DIAGNOSIS — F32A Depression, unspecified: Secondary | ICD-10-CM

## 2014-11-18 DIAGNOSIS — F329 Major depressive disorder, single episode, unspecified: Secondary | ICD-10-CM

## 2014-11-18 MED ORDER — SERTRALINE HCL 50 MG PO TABS: 50.0000 mg | ORAL_TABLET | Freq: Every day | ORAL | Status: DC

## 2014-11-18 NOTE — Assessment & Plan Note (Signed)
Increase zoloft from 25 to 50 mg daily  [facility]

## 2014-11-18 NOTE — Patient Instructions (Signed)
Barbara HesselbachMaria was seen today for depression.  Diagnoses and all orders for this visit:  Healthcare maintenance -     Flu Vaccine QUAD 36+ mos IM  Severe recurrent major depression without psychotic features (HCC) -     sertraline (ZOLOFT) 50 MG tablet; Take 1 tablet (50 mg total) by mouth daily.  Depression -     sertraline (ZOLOFT) 50 MG tablet; Take 1 tablet (50 mg total) by mouth daily.    Increase zoloft from 25 to 50 mg daily Continue namenda and aricept  [facility]  F/u in 4 weeks for depression   Dr. Armen PickupFunches

## 2014-11-18 NOTE — Progress Notes (Signed)
Subjective:  Patient ID: Barbara Beck, female    DOB: [birth year]  Age: 81 y.o. MRN: [id number]  CC: Depression spanish interpreter used   HPI Barbara Beck presents for depression. She presents today with her granddaughter.   1. ED f/u depression: patient reports little energy and sleeping quite a bit. She is taking zoloft. Slight improvement in depression. She thinks about death but denies SI. She is eating less. She reports food not tasting well and and bad breath.    Social History  Substance Use Topics  . Smoking status: Former Smoker -- 0.00 packs/day for 13 years    Types: Cigarettes    Quit date: 01/19/1984  . Smokeless tobacco: Never Used  . Alcohol Use: No     Comment: denied    Outpatient Prescriptions Prior to Visit  Medication Sig Dispense Refill  . amLODipine (NORVASC) 10 MG tablet Take 1 tablet (10 mg total) by mouth daily. 90 tablet 3  . Calcium Carb-Cholecalciferol (CALCIUM-VITAMIN D) 600-400 MG-UNIT TABS Take 1 tablet by mouth daily.     . feeding supplement, ENSURE ENLIVE, (ENSURE ENLIVE) LIQD Take 237 mLs by mouth 3 (three) times daily between meals. 237 mL 12  . fluticasone (FLONASE) 50 MCG/ACT nasal spray Place 2 sprays into both nostrils daily. 16 g 6  . lisinopril-hydrochlorothiazide (PRINZIDE,ZESTORETIC) 20-25 MG tablet Take 1 tablet by mouth daily.    . memantine (NAMENDA) 5 MG tablet Take 1 tablet (5 mg total) by mouth 2 (two) times daily. 60 tablet 3  . ranitidine (ZANTAC) 150 MG tablet TAKE 1 TABLET BY MOUTH 2 TIMES DAILY (Patient taking differently: TAKE 1 TABLET BY MOUTH TWICE DAILY.) 60 tablet 2  . sertraline (ZOLOFT) 25 MG tablet Take 1 tablet (25 mg total) by mouth daily. 30 tablet 3  . cyanocobalamin 500 MCG tablet Take 1 tablet (500 mcg total) by mouth daily. (Patient not taking: Reported on 11/18/2014) 90 tablet 3  . donepezil (ARICEPT) 5 MG tablet Take 1 tablet (5 mg total) by mouth at bedtime. (Patient not taking: Reported on 11/18/2014) 30  tablet 3  . nystatin (MYCOSTATIN) 100000 UNIT/ML suspension Take 5 mLs (500,000 Units total) by mouth 4 (four) times daily. (Patient not taking: Reported on 11/18/2014) 60 mL 0  . sertraline (ZOLOFT) 25 MG tablet Take 1 tablet (25 mg total) by mouth daily. (Patient not taking: Reported on 10/27/2014) 30 tablet 0   No facility-administered medications prior to visit.    ROS Review of Systems  Constitutional: Negative for fever and chills.  Eyes: Negative for visual disturbance.  Respiratory: Negative for shortness of breath.   Cardiovascular: Negative for chest pain.  Gastrointestinal: Negative for abdominal pain and blood in stool.  Musculoskeletal: Negative for back pain and arthralgias.  Skin: Negative for rash.  Allergic/Immunologic: Negative for immunocompromised state.  Hematological: Negative for adenopathy. Does not bruise/bleed easily.  Psychiatric/Behavioral: Positive for dysphoric mood. Negative for suicidal ideas. The patient is nervous/anxious.     Objective:  BP 168/74 mmHg  Pulse 64  Temp(Src) 98 F (36.7 C) (Oral)  Resp 16  Ht  (1.499 m)  Wt 117 lb (53.071 kg)  BMI 23.62 kg/m2  SpO2 99%  BP/Weight 11/18/2014 10/28/2014 10/23/2014  Systolic BP 168 136 128  Diastolic BP 74 56 66  Wt. (Lbs) 117 - -  BMI 23.62 - -  Some encounter information is confidential and restricted. Go to Review Flowsheets activity to see all data.   Wt Readings from Last 3 Encounters:  11/18/14 117 lb (53.071 kg)  10/22/14 119 lb (53.978 kg)  10/18/14 119 lb (53.978 kg)    Physical Exam  Constitutional: She is oriented to person, place, and time. She appears well-developed and well-nourished. No distress.  HENT:  Head: Normocephalic and atraumatic.  Cardiovascular: Normal rate, regular rhythm, normal heart sounds and intact distal pulses.   Pulmonary/Chest: Effort normal and breath sounds normal.  Musculoskeletal: She exhibits no edema.  Neurological: She is alert and  oriented to person, place, and time.  Skin: Skin is warm and dry. No rash noted.  Psychiatric: She has a normal mood and affect.    Depression screen PHQ 2/9 11/18/2014  Decreased Interest 3  Down, Depressed, Hopeless 3  PHQ - 2 Score 6  Altered sleeping 2  Tired, decreased energy 0  Change in appetite 3  Feeling bad or failure about yourself  2  Trouble concentrating 1  Moving slowly or fidgety/restless 2  Suicidal thoughts 3  PHQ-9 Score 19   GAD 7 : Generalized Anxiety Score 11/18/2014  Nervous, Anxious, on Edge 3  Control/stop worrying 0  Worry too much - different things 2  Trouble relaxing 3  Restless 2  Easily annoyed or irritable 3  Afraid - awful might happen 3  Total GAD 7 Score 16  Anxiety Difficulty Not difficult at all     Assessment & Plan:   Problem List Items Addressed This Visit    Depression   Relevant Medications   sertraline (ZOLOFT) 50 MG tablet   Severe recurrent major depression without psychotic features (HCC)    Increase zoloft from 25 to 50 mg daily  [facility]       Relevant Medications   sertraline (ZOLOFT) 50 MG tablet    Other Visit Diagnoses    Healthcare maintenance    -  Primary    Relevant Orders    Flu Vaccine QUAD 36+ mos IM (Completed)        No orders of the defined types were placed in this encounter.    Follow-up: No Follow-up on file.   Dessa PhiJosalyn Varnell Orvis MD

## 2014-12-16 ENCOUNTER — Encounter: Payer: Self-pay | Admitting: Family Medicine

## 2014-12-16 ENCOUNTER — Other Ambulatory Visit: Payer: Self-pay | Admitting: Family Medicine

## 2014-12-16 ENCOUNTER — Ambulatory Visit: Payer: Self-pay | Attending: Family Medicine | Admitting: Family Medicine

## 2014-12-16 DIAGNOSIS — M791 Myalgia, unspecified site: Secondary | ICD-10-CM

## 2014-12-16 DIAGNOSIS — F32A Depression, unspecified: Secondary | ICD-10-CM

## 2014-12-16 DIAGNOSIS — Z23 Encounter for immunization: Secondary | ICD-10-CM

## 2014-12-16 DIAGNOSIS — F329 Major depressive disorder, single episode, unspecified: Secondary | ICD-10-CM | POA: Insufficient documentation

## 2014-12-16 MED ORDER — ACETAMINOPHEN ER 650 MG PO TBCR: 650.0000 mg | EXTENDED_RELEASE_TABLET | Freq: Two times a day (BID) | ORAL | Status: AC

## 2014-12-16 MED ORDER — ENSURE ENLIVE PO LIQD: 237.0000 mL | Freq: Three times a day (TID) | ORAL | Status: AC

## 2014-12-16 NOTE — Progress Notes (Signed)
Patient ID: Barbara Beck, female   DOB: [birth year], 81 y.o.   MRN: [id number]   Subjective:  Patient ID: Barbara Beck, female    DOB: July 26, 1927  Age: 81 y.o. MRN: [id number]  CC: Depression spanish interpreter used   HPI Barbara Beck presents for depression. She presents today with her granddaughter.   1. Depression: improved. Eating better. Taking Abilify and Wellbutrin. Treated by mental health at [facility].  2. Thigh pain: b/l upper thighs. L>R. Not taking pain medicine. No back pain. Pain does not radiate.    Social History  Substance Use Topics  . Smoking status: Former Smoker -- 0.00 packs/day for 13 years    Types: Cigarettes    Quit date: 01/19/1984  . Smokeless tobacco: Never Used  . Alcohol Use: No     Comment: denied    Outpatient Prescriptions Prior to Visit  Medication Sig Dispense Refill  . amLODipine (NORVASC) 10 MG tablet Take 1 tablet (10 mg total) by mouth daily. 90 tablet 3  . Calcium Carb-Cholecalciferol (CALCIUM-VITAMIN D) 600-400 MG-UNIT TABS Take 1 tablet by mouth daily.     . cyanocobalamin 500 MCG tablet Take 1 tablet (500 mcg total) by mouth daily. 90 tablet 3  . donepezil (ARICEPT) 5 MG tablet Take 1 tablet (5 mg total) by mouth at bedtime. 30 tablet 3  . fluticasone (FLONASE) 50 MCG/ACT nasal spray Place 2 sprays into both nostrils daily. 16 g 6  . lisinopril-hydrochlorothiazide (PRINZIDE,ZESTORETIC) 20-25 MG tablet Take 1 tablet by mouth daily.    . memantine (NAMENDA) 5 MG tablet Take 1 tablet (5 mg total) by mouth 2 (two) times daily. 60 tablet 3  . ranitidine (ZANTAC) 150 MG tablet TAKE 1 TABLET BY MOUTH 2 TIMES DAILY 60 tablet 2  . feeding supplement, ENSURE ENLIVE, (ENSURE ENLIVE) LIQD Take 237 mLs by mouth 3 (three) times daily between meals. (Patient not taking: Reported on 12/16/2014) 237 mL 12  . nystatin (MYCOSTATIN) 100000 UNIT/ML suspension Take 5 mLs (500,000 Units total) by mouth 4 (four) times daily. (Patient  not taking: Reported on 11/18/2014) 60 mL 0  . sertraline (ZOLOFT) 50 MG tablet Take 1 tablet (50 mg total) by mouth daily. (Patient not taking: Reported on 12/16/2014) 30 tablet 3   No facility-administered medications prior to visit.    ROS Review of Systems  Constitutional: Negative for fever and chills.  Eyes: Negative for visual disturbance.  Respiratory: Negative for shortness of breath.   Cardiovascular: Negative for chest pain.  Gastrointestinal: Negative for abdominal pain and blood in stool.  Musculoskeletal: Positive for myalgias. Negative for back pain and arthralgias.  Skin: Negative for rash.  Allergic/Immunologic: Negative for immunocompromised state.  Hematological: Negative for adenopathy. Does not bruise/bleed easily.  Psychiatric/Behavioral: Positive for dysphoric mood. Negative for suicidal ideas. The patient is nervous/anxious.     Objective:  BP 154/70 mmHg  Pulse 61  Temp(Src) 98.4 F (36.9 C) (Oral)  Resp 18  Wt 121 lb (54.885 kg)  SpO2 99%  BP/Weight 12/16/2014 11/18/2014 10/28/2014  Systolic BP 154 168 136  Diastolic BP 70 74 56  Wt. (Lbs) 121 117 -  BMI 24.43 23.62 -  Some encounter information is confidential and restricted. Go to Review Flowsheets activity to see all data.   Wt Readings from Last 3 Encounters:  12/16/14 121 lb (54.885 kg)  11/18/14 117 lb (53.071 kg)  10/22/14 119 lb (53.978 kg)    Physical Exam  Constitutional: She is oriented to person, place,  and time. She appears well-developed and well-nourished. No distress.  HENT:  Head: Normocephalic and atraumatic.  Cardiovascular: Normal rate, regular rhythm, normal heart sounds and intact distal pulses.   Pulmonary/Chest: Effort normal and breath sounds normal.  Musculoskeletal: She exhibits no edema.  Neurological: She is alert and oriented to person, place, and time.  Skin: Skin is warm and dry. No rash noted.  Psychiatric: She has a normal mood and affect.    Depression  [facility] 12/16/2014 11/18/2014 02/19/2014  Decreased Interest 0 3 0  Down, Depressed, Hopeless 3 3 0  PHQ - 2 Score 3 6 0  Altered sleeping 3 2 -  Tired, decreased energy 1 0 -  Change in appetite 3 3 -  Feeling bad or failure about yourself  0 2 -  Trouble concentrating 0 1 -  Moving slowly or fidgety/restless 0 2 -  Suicidal thoughts 0 3 -  PHQ-9 Score 10 19 -    GAD 7 : Generalized Anxiety Score 12/16/2014 11/18/2014  Nervous, Anxious, on Edge 3 3  Control/stop worrying 3 0  Worry too much - different things 2 2  Trouble relaxing 2 3  Restless 0 2  Easily annoyed or irritable 0 3  Afraid - awful might happen 0 3  Total GAD 7 Score 10 16  Anxiety Difficulty - Not difficult at all     Assessment & Plan:   Problem List Items Addressed This Visit    Depression   Relevant Medications   buPROPion (WELLBUTRIN XL) 150 MG 24 hr tablet   feeding supplement, ENSURE ENLIVE, (ENSURE ENLIVE) LIQD   Myalgia   Relevant Medications   acetaminophen (TYLENOL 8 HOUR) 650 MG CR tablet       No orders of the defined types were placed in this encounter.    Follow-up: No Follow-up on file.   Dessa PhiJosalyn Korrey Schleicher MD

## 2014-12-16 NOTE — Patient Instructions (Addendum)
Byrd HesselbachMaria was seen today for depression.  Diagnoses and all orders for this visit:  Depression -     feeding supplement, ENSURE ENLIVE, (ENSURE ENLIVE) LIQD; Take 237 mLs by mouth 3 (three) times daily between meals.  Myalgia -     acetaminophen (TYLENOL 8 HOUR) 650 MG CR tablet; Take 1 tablet (650 mg total) by mouth 2 (two) times daily.   Continue current regimen Add tylenol 650 mg twice daily   F/u in 6 weeks   Dr .Armen PickupFunches

## 2014-12-16 NOTE — Progress Notes (Signed)
F/U Depression Stated tongue color changes, balck and blue No pain today  No suicide thought in the past two weeks

## 2016-05-05 IMAGING — CT CT HEAD W/O CM
1 series · 16 of 30 positions shown, 20 images · non-contrast
Comparison: 10/09/2014 CT and MR.

CLINICAL DATA: 87-year-old female with altered mental status and
behavioral changes.

EXAM:
CT HEAD WITHOUT CONTRAST
TECHNIQUE: Contiguous axial images were obtained from the base of the skull
through the vertex without intravenous contrast.

[Series 2: headseq 4.8 h45s · axial · 0.43mm/px · z∈[-124,+5]mm · 16 of 30 slices shown, 20 images]
[im 2/30  brain]
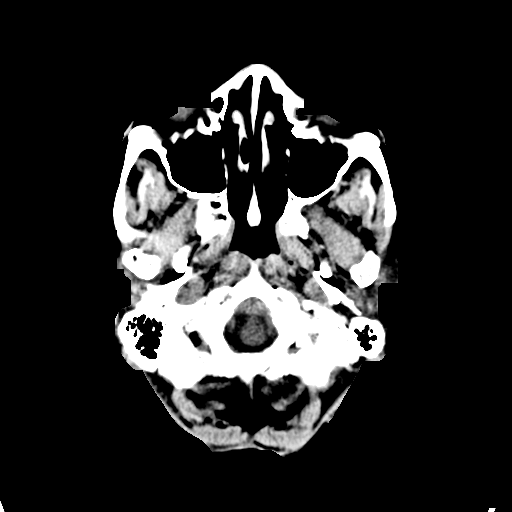
[im 2/30  bone]
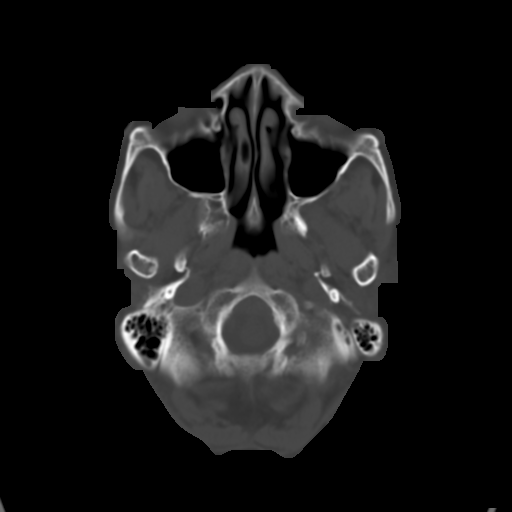
[im 4/30  brain]
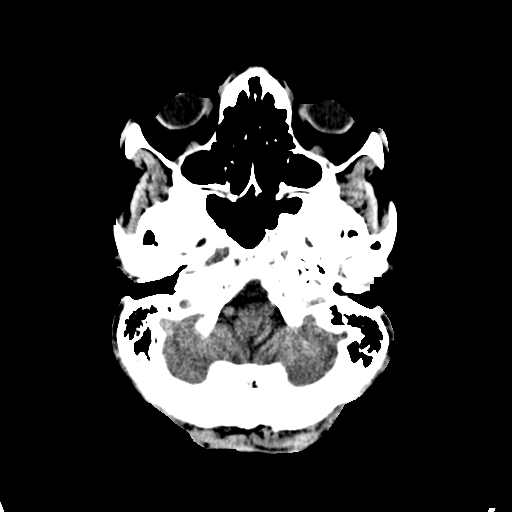
[im 6/30  brain]
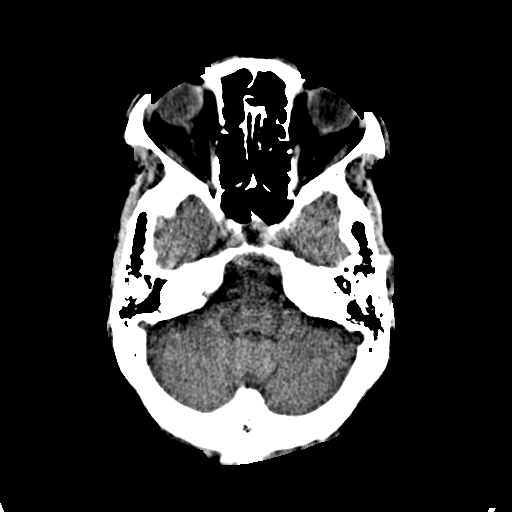
[im 8/30  brain]
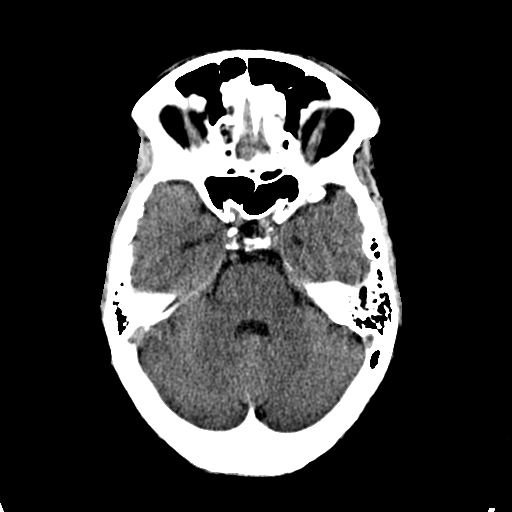
[im 9/30  brain]
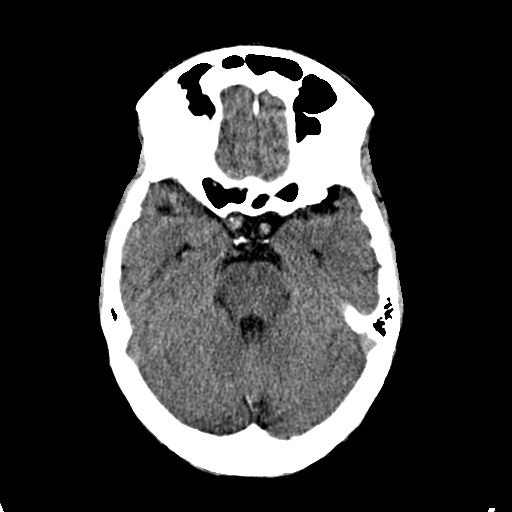
[im 9/30  bone]
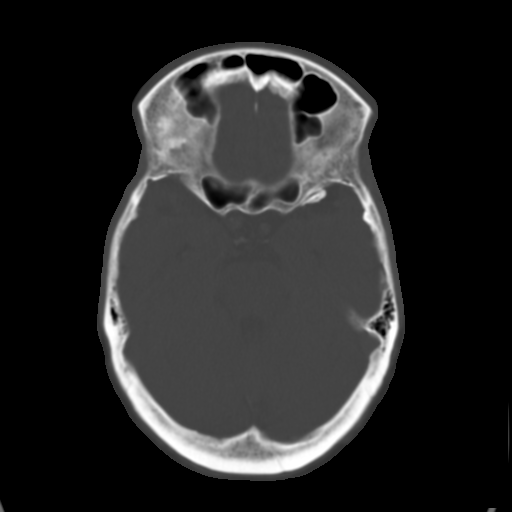
[im 11/30  brain]
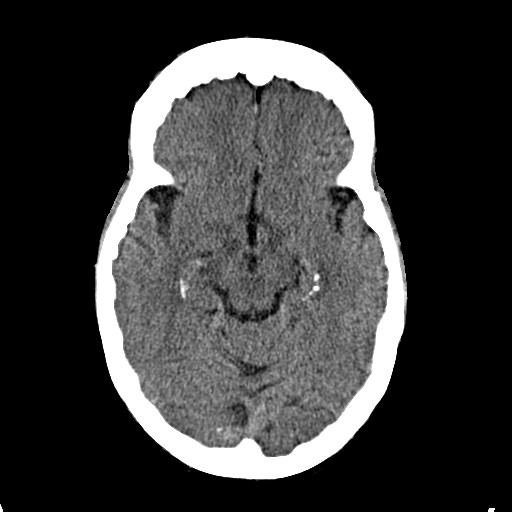
[im 13/30  brain]
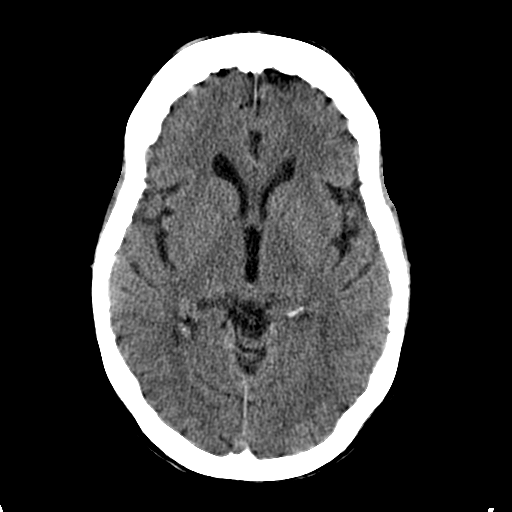
[im 15/30  brain]
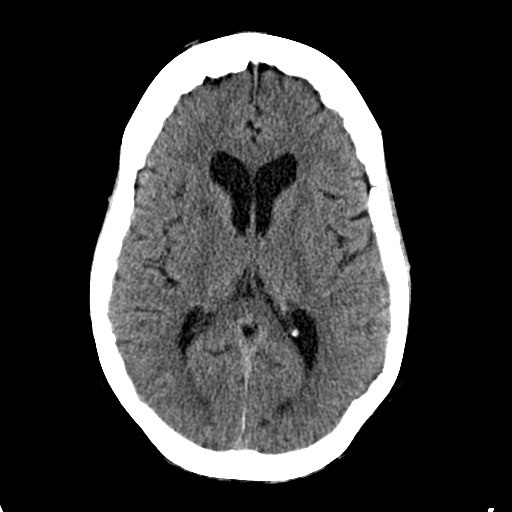
[im 16/30  brain]
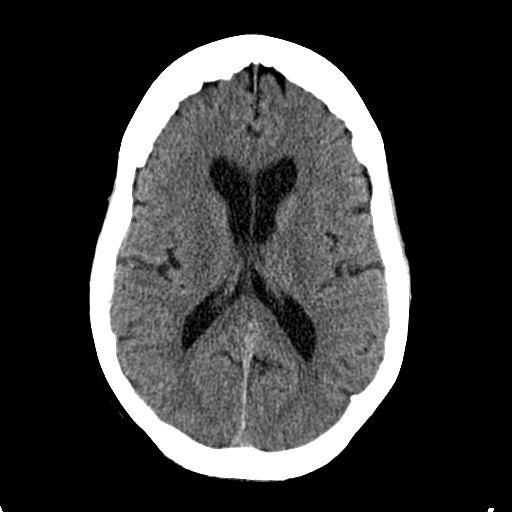
[im 16/30  bone]
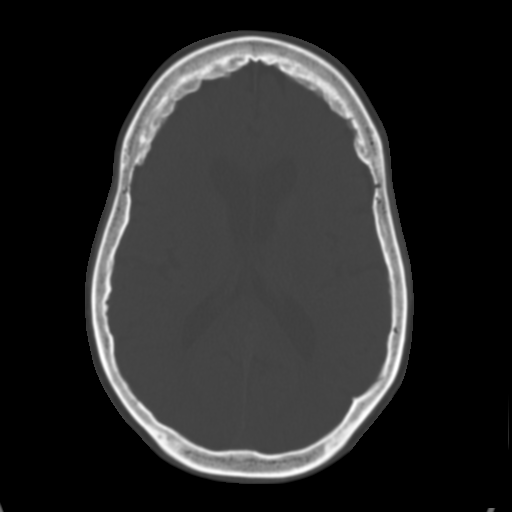
[im 18/30  brain]
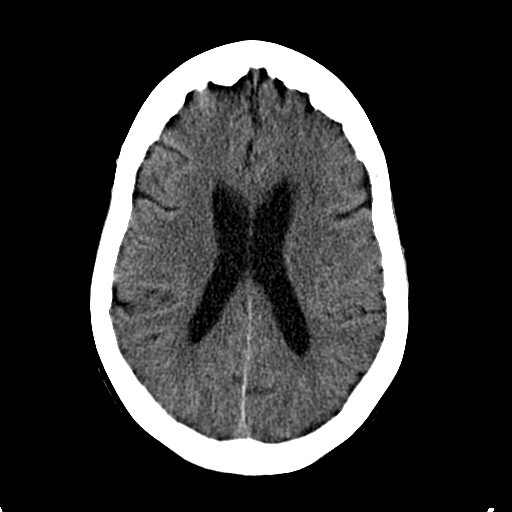
[im 20/30  brain]
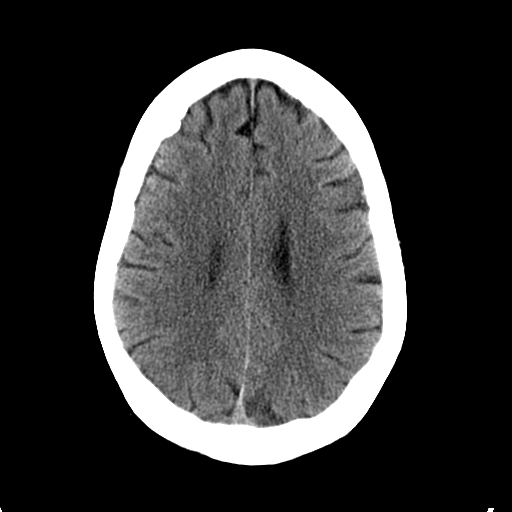
[im 22/30  brain]
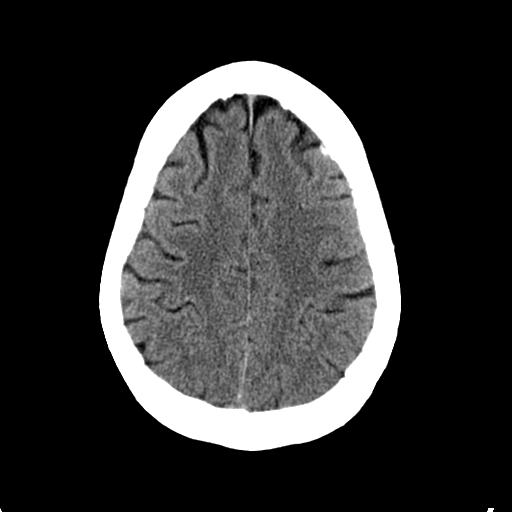
[im 23/30  brain]
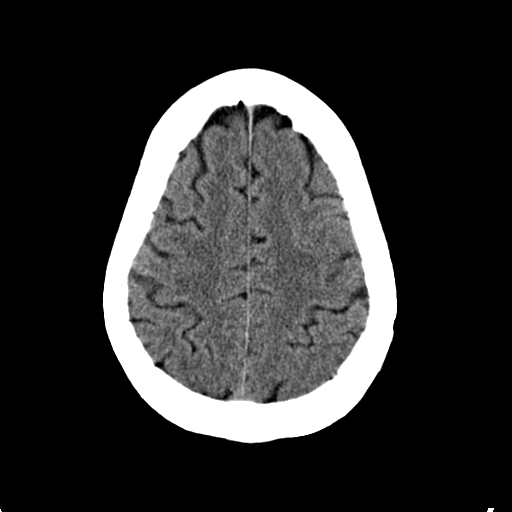
[im 23/30  bone]
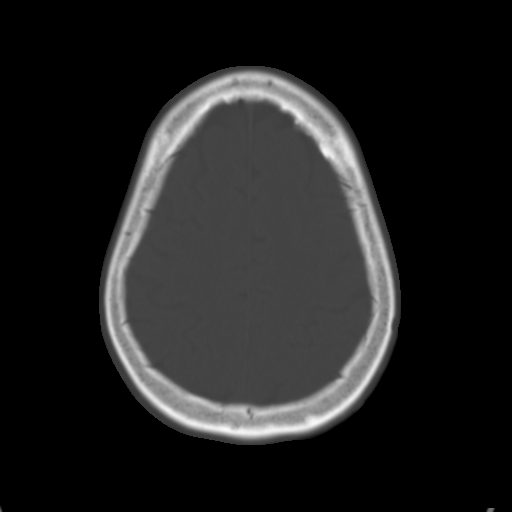
[im 25/30  brain]
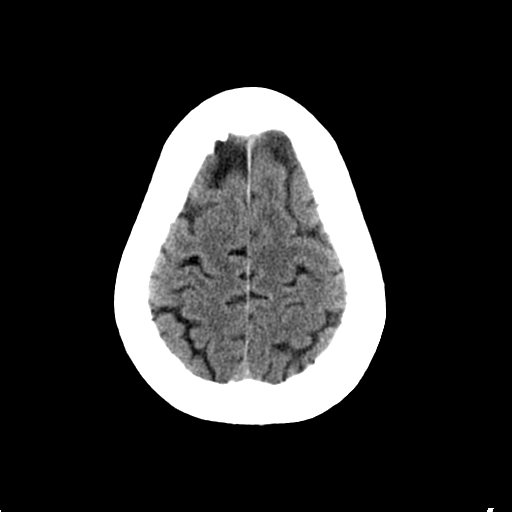
[im 27/30  brain]
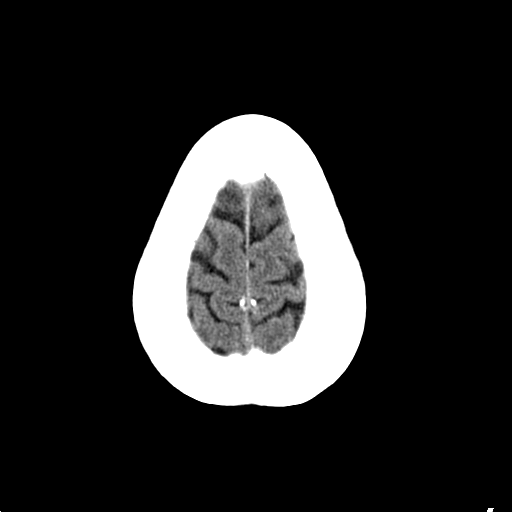
[im 29/30  brain]
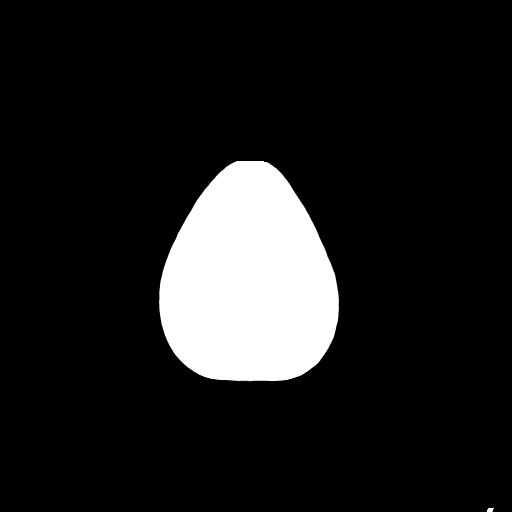

[16 of 30 positions shown; findings below may reference images not displayed]

FINDINGS: Mild chronic small-vessel white matter ischemic changes are again
identified.

No acute intracranial abnormalities are identified, including mass
lesion or mass effect, hydrocephalus, extra-axial fluid collection,
midline shift, hemorrhage, or acute infarction.

The visualized bony calvarium is unremarkable.
IMPRESSION: No evidence of acute intracranial abnormality.

Mild chronic small-vessel white matter ischemic changes.

## 2016-05-08 IMAGING — CR DG CHEST 2V
2 series · 2 of 2 positions shown · non-contrast
Comparison: 10/19/2014.

CLINICAL DATA: Psychiatric clearance.  History of hypertension.

EXAM:
CHEST  2 VIEW

[w chest lat]
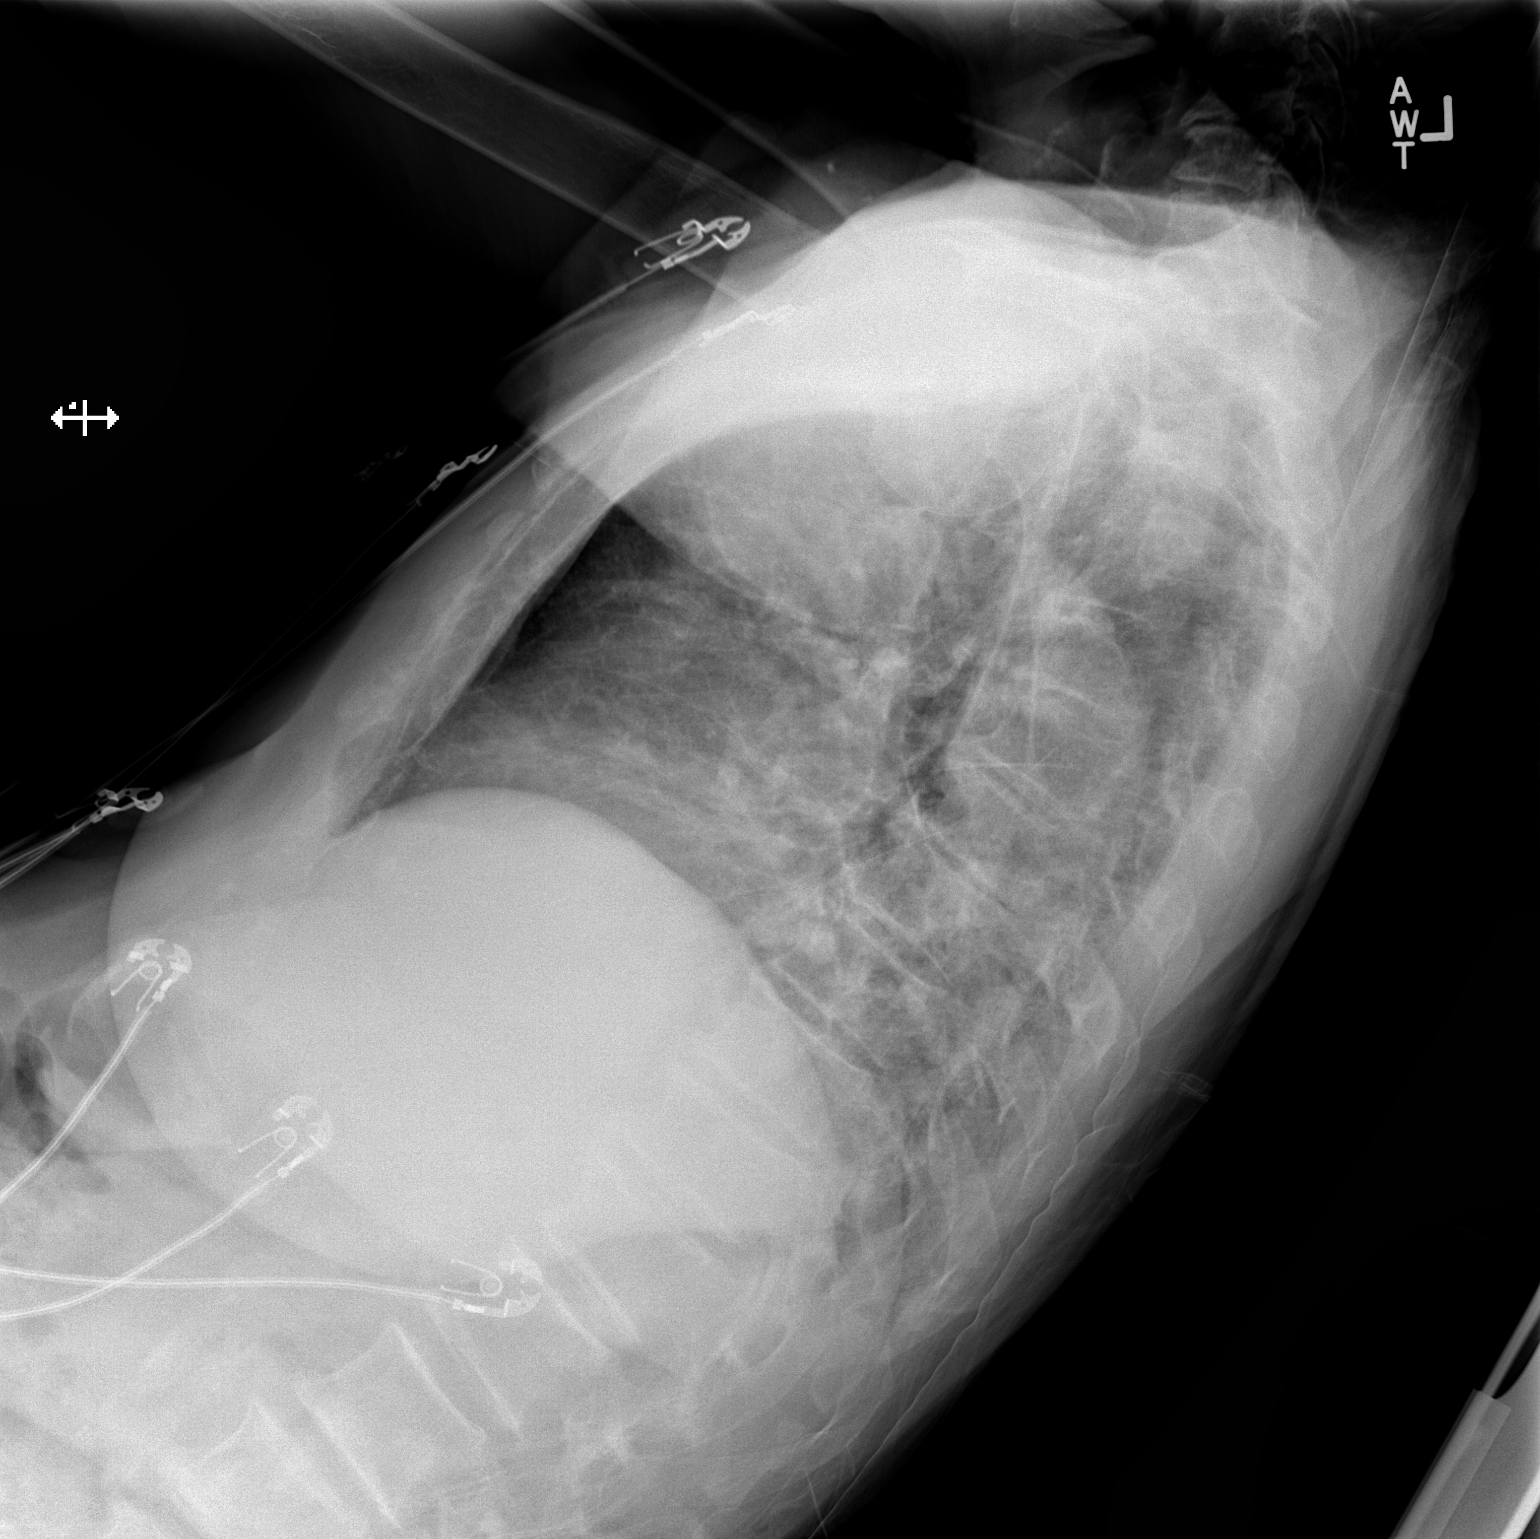

[x chest ap]
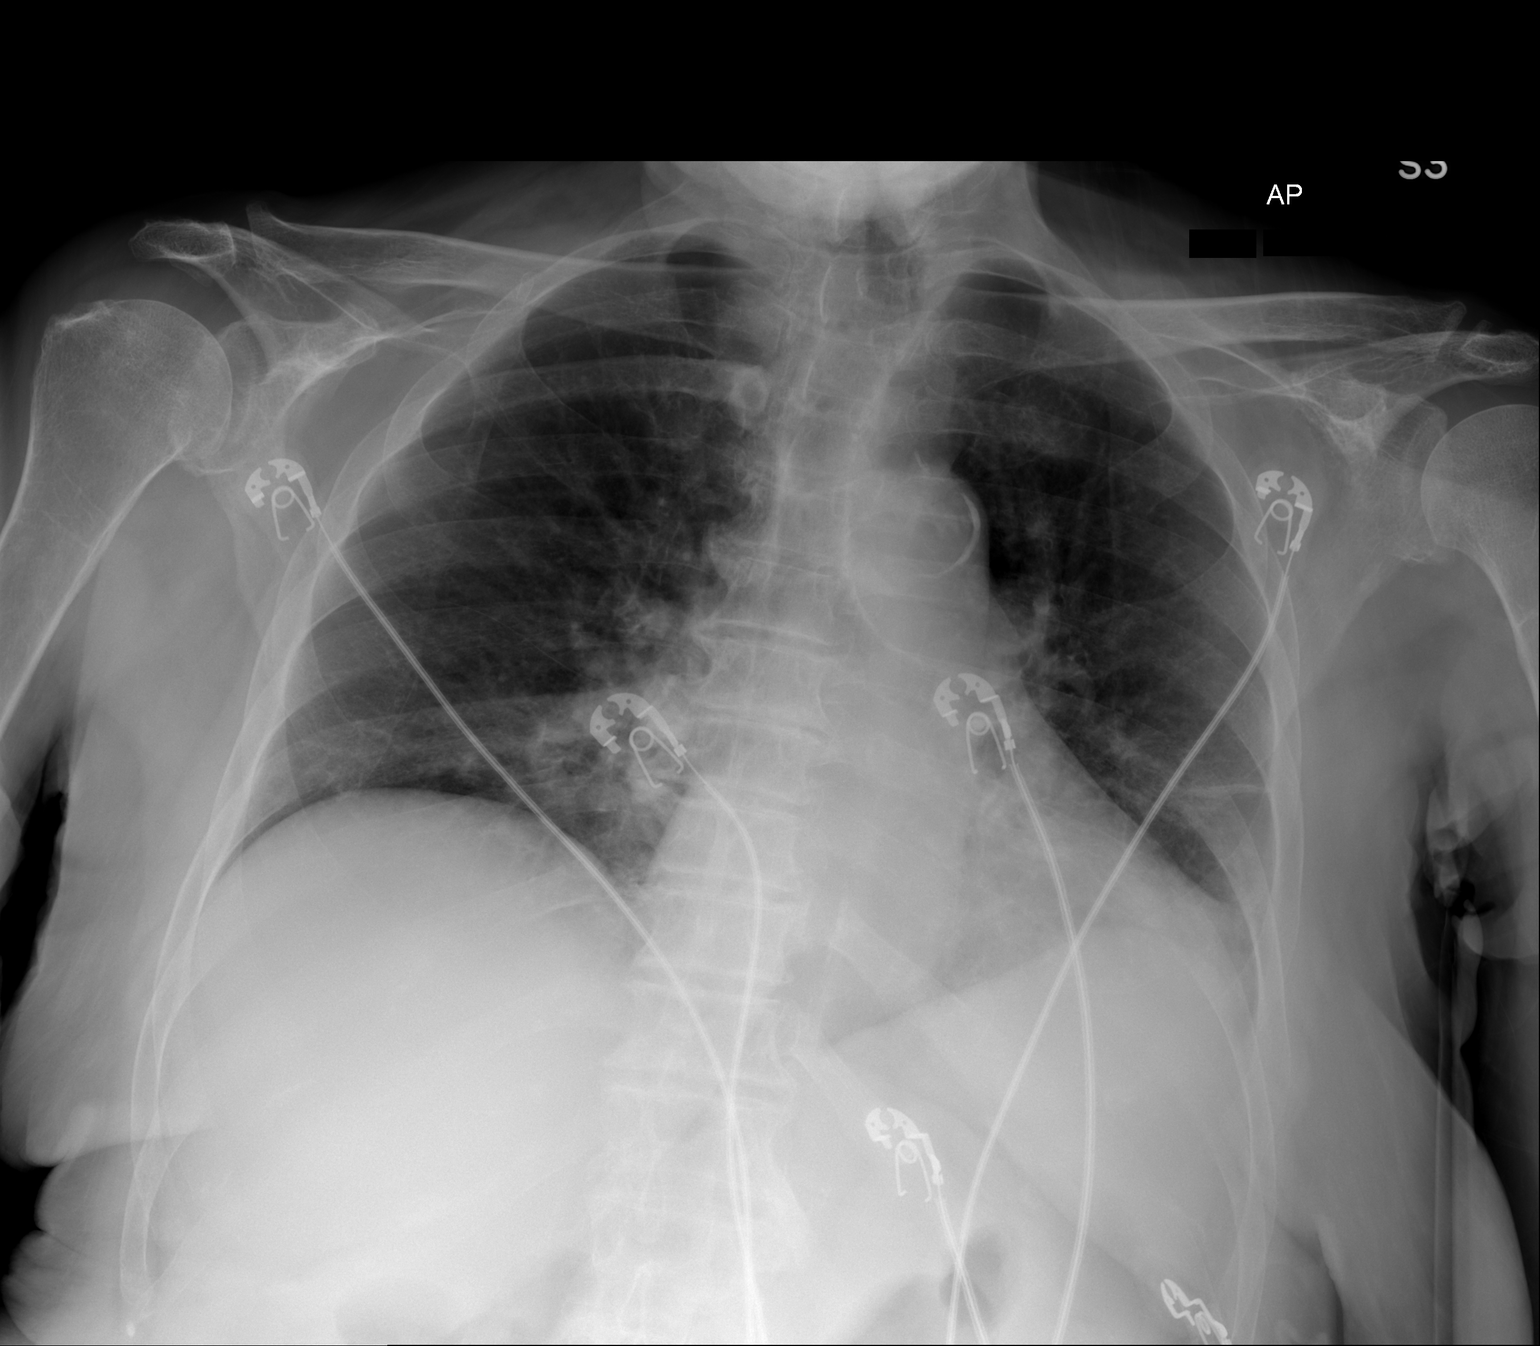

[2 of 2 positions shown; findings below may reference images not displayed]

FINDINGS: Poor inspiration. Stable linear scarring in the left lower lung
zone. Minimal bibasilar atelectasis. Borderline enlarged cardiac
silhouette with a mild decrease in size. Mild bilateral shoulder
degenerative changes.
IMPRESSION: 1. Poor inspiration with minimal bibasilar atelectasis.
2. Borderline cardiomegaly, with improvement.

## 2016-05-13 IMAGING — DX DG CHEST 1V PORT
1 series · 1 of 1 positions shown · non-contrast
Comparison: 10/22/2014 and 10/19/2014 and 10/06/2014 and 01/29/2014

CLINICAL DATA: Altered mental status.  Syncope today.

EXAM:
PORTABLE CHEST 1 VIEW

[chest ap]
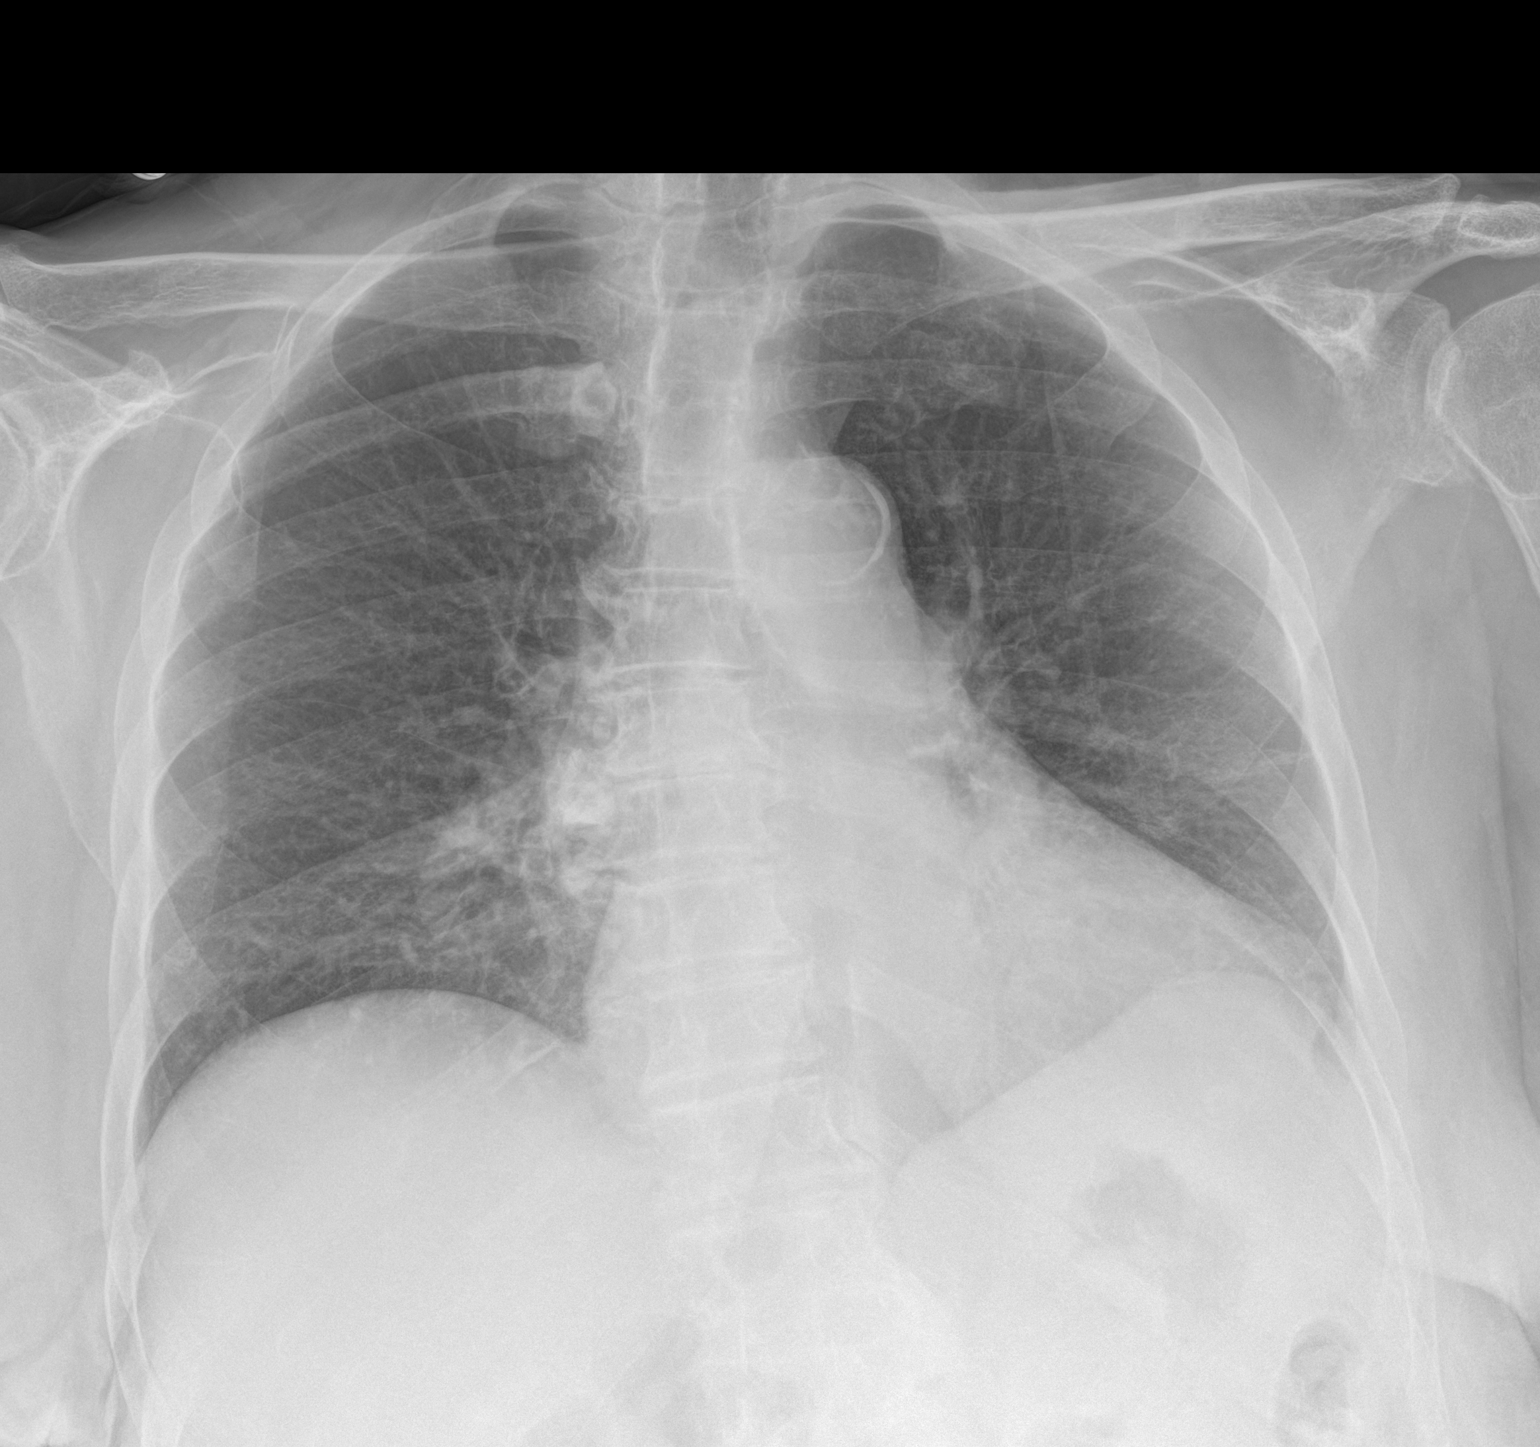

[1 of 1 positions shown; findings below may reference images not displayed]

FINDINGS: Chronic cardiomegaly with calcification of the thoracic aorta.
Chronic accentuation of the interstitial markings. Small focal
linear area of scarring in the left lung base laterally.

No acute infiltrates or effusions.  No acute osseous abnormality.
IMPRESSION: No acute abnormalities.  Chronic cardiomegaly.
